# Patient Record
Sex: Female | Born: 1965
Health system: Southern US, Community
[De-identification: ages and names within clinical notes are randomized; demographics above are authoritative.]

## PROBLEM LIST (undated history)

## (undated) DIAGNOSIS — E538 Deficiency of other specified B group vitamins: Secondary | ICD-10-CM

## (undated) DIAGNOSIS — N939 Abnormal uterine and vaginal bleeding, unspecified: Secondary | ICD-10-CM

## (undated) DIAGNOSIS — Z86018 Personal history of other benign neoplasm: Secondary | ICD-10-CM

## (undated) DIAGNOSIS — M25561 Pain in right knee: Secondary | ICD-10-CM

## (undated) DIAGNOSIS — R5383 Other fatigue: Secondary | ICD-10-CM

## (undated) DIAGNOSIS — N63 Unspecified lump in unspecified breast: Secondary | ICD-10-CM

## (undated) DIAGNOSIS — D649 Anemia, unspecified: Secondary | ICD-10-CM

## (undated) DIAGNOSIS — E559 Vitamin D deficiency, unspecified: Secondary | ICD-10-CM

## (undated) DIAGNOSIS — D72819 Decreased white blood cell count, unspecified: Secondary | ICD-10-CM

## (undated) DIAGNOSIS — R5381 Other malaise: Secondary | ICD-10-CM

## (undated) DIAGNOSIS — D1800 Hemangioma unspecified site: Secondary | ICD-10-CM

## (undated) HISTORY — DX: Deficiency of other specified B group vitamins: E53.8

## (undated) HISTORY — DX: Unspecified lump in unspecified breast: N63.0

## (undated) HISTORY — PX: PARATHYROIDECTOMY: SHX19

## (undated) HISTORY — DX: Decreased white blood cell count, unspecified: D72.819

## (undated) HISTORY — DX: Personal history of other benign neoplasm: Z86.018

## (undated) HISTORY — PX: MOUTH SURGERY: SHX715

## (undated) HISTORY — DX: Vitamin D deficiency, unspecified: E55.9

## (undated) HISTORY — DX: Other malaise: R53.83

## (undated) HISTORY — DX: Pain in right knee: M25.561

## (undated) HISTORY — PX: TUBAL LIGATION: SHX77

## (undated) HISTORY — PX: ABDOMINAL HYSTERECTOMY: SHX81

## (undated) HISTORY — DX: Other malaise: R53.81

---

## 2003-11-25 ENCOUNTER — Ambulatory Visit: Payer: Self-pay | Admitting: Internal Medicine

## 2003-12-22 ENCOUNTER — Ambulatory Visit: Payer: Self-pay | Admitting: Internal Medicine

## 2004-03-02 ENCOUNTER — Ambulatory Visit: Payer: Self-pay | Admitting: Internal Medicine

## 2004-03-23 ENCOUNTER — Ambulatory Visit: Payer: Self-pay | Admitting: Internal Medicine

## 2004-06-21 ENCOUNTER — Ambulatory Visit: Payer: Self-pay | Admitting: Internal Medicine

## 2004-07-21 ENCOUNTER — Ambulatory Visit: Payer: Self-pay | Admitting: Internal Medicine

## 2004-11-22 ENCOUNTER — Ambulatory Visit: Payer: Self-pay | Admitting: Unknown Physician Specialty

## 2005-01-09 ENCOUNTER — Ambulatory Visit: Payer: Self-pay | Admitting: Internal Medicine

## 2006-01-03 ENCOUNTER — Ambulatory Visit: Payer: Self-pay

## 2006-09-21 ENCOUNTER — Ambulatory Visit: Payer: Self-pay | Admitting: Internal Medicine

## 2006-10-08 ENCOUNTER — Ambulatory Visit: Payer: Self-pay | Admitting: Internal Medicine

## 2006-10-22 ENCOUNTER — Ambulatory Visit: Payer: Self-pay | Admitting: Internal Medicine

## 2008-02-27 DIAGNOSIS — N63 Unspecified lump in unspecified breast: Secondary | ICD-10-CM | POA: Insufficient documentation

## 2008-03-03 ENCOUNTER — Ambulatory Visit: Payer: Self-pay | Admitting: Family Medicine

## 2008-10-27 ENCOUNTER — Ambulatory Visit: Payer: Self-pay | Admitting: Family Medicine

## 2009-04-06 ENCOUNTER — Ambulatory Visit: Payer: Self-pay | Admitting: Family Medicine

## 2010-08-16 ENCOUNTER — Ambulatory Visit: Payer: Self-pay | Admitting: Family Medicine

## 2011-07-25 ENCOUNTER — Ambulatory Visit: Payer: Self-pay | Admitting: Internal Medicine

## 2011-08-21 ENCOUNTER — Ambulatory Visit: Payer: Self-pay | Admitting: Internal Medicine

## 2011-11-16 LAB — HM PAP SMEAR: HM Pap smear: NORMAL

## 2011-12-06 ENCOUNTER — Ambulatory Visit: Payer: Self-pay | Admitting: Family Medicine

## 2011-12-11 ENCOUNTER — Ambulatory Visit: Payer: Self-pay | Admitting: Family Medicine

## 2012-01-03 ENCOUNTER — Ambulatory Visit: Payer: Self-pay | Admitting: Family Medicine

## 2012-01-17 ENCOUNTER — Ambulatory Visit: Payer: Self-pay | Admitting: Family Medicine

## 2012-07-05 ENCOUNTER — Ambulatory Visit: Payer: Self-pay | Admitting: Family Medicine

## 2012-07-21 ENCOUNTER — Ambulatory Visit: Payer: Self-pay | Admitting: Internal Medicine

## 2013-03-28 ENCOUNTER — Ambulatory Visit: Payer: Self-pay | Admitting: Family Medicine

## 2013-03-28 LAB — HM MAMMOGRAPHY: HM MAMMO: NORMAL

## 2013-04-18 LAB — LIPID PANEL
CHOLESTEROL: 179 mg/dL (ref 0–200)
HDL: 51 mg/dL (ref 35–70)
LDL CALC: 105 mg/dL
Triglycerides: 117 mg/dL (ref 40–160)

## 2013-04-18 LAB — HEMOGLOBIN A1C: HEMOGLOBIN A1C: 5.4 % (ref 4.0–6.0)

## 2013-05-30 ENCOUNTER — Ambulatory Visit: Payer: Self-pay | Admitting: Gastroenterology

## 2013-05-30 LAB — HM COLONOSCOPY

## 2014-10-12 ENCOUNTER — Telehealth: Payer: Self-pay | Admitting: Family Medicine

## 2014-10-12 NOTE — Telephone Encounter (Signed)
Patient informed and appointment made for tomorrow afternoon

## 2014-10-12 NOTE — Telephone Encounter (Signed)
She may want to come in sooner for the problem and keep the CPE appointment

## 2014-10-12 NOTE — Telephone Encounter (Signed)
Patient has annual physical scheduled for Sept 8, 2016 however she has been experiencing heavly prolonged menstrual bleeding and clotting for 2 weeks. Do she need to schedule a sooner appointment or keep the one she has. Please advise

## 2014-10-13 ENCOUNTER — Encounter: Payer: Self-pay | Admitting: Family Medicine

## 2014-10-13 ENCOUNTER — Ambulatory Visit (INDEPENDENT_AMBULATORY_CARE_PROVIDER_SITE_OTHER): Payer: Managed Care, Other (non HMO) | Admitting: Family Medicine

## 2014-10-13 VITALS — BP 126/82 | HR 100 | Temp 98.4°F | Resp 14 | Ht 70.0 in | Wt 266.9 lb

## 2014-10-13 DIAGNOSIS — E538 Deficiency of other specified B group vitamins: Secondary | ICD-10-CM | POA: Diagnosis not present

## 2014-10-13 DIAGNOSIS — D1803 Hemangioma of intra-abdominal structures: Secondary | ICD-10-CM | POA: Insufficient documentation

## 2014-10-13 DIAGNOSIS — E559 Vitamin D deficiency, unspecified: Secondary | ICD-10-CM | POA: Diagnosis not present

## 2014-10-13 DIAGNOSIS — D72819 Decreased white blood cell count, unspecified: Secondary | ICD-10-CM | POA: Insufficient documentation

## 2014-10-13 DIAGNOSIS — Z23 Encounter for immunization: Secondary | ICD-10-CM

## 2014-10-13 DIAGNOSIS — N951 Menopausal and female climacteric states: Secondary | ICD-10-CM

## 2014-10-13 DIAGNOSIS — Z13220 Encounter for screening for lipoid disorders: Secondary | ICD-10-CM

## 2014-10-13 DIAGNOSIS — E669 Obesity, unspecified: Secondary | ICD-10-CM | POA: Diagnosis not present

## 2014-10-13 DIAGNOSIS — R5383 Other fatigue: Secondary | ICD-10-CM

## 2014-10-13 DIAGNOSIS — N921 Excessive and frequent menstruation with irregular cycle: Secondary | ICD-10-CM

## 2014-10-13 DIAGNOSIS — M1710 Unilateral primary osteoarthritis, unspecified knee: Secondary | ICD-10-CM | POA: Insufficient documentation

## 2014-10-13 DIAGNOSIS — M179 Osteoarthritis of knee, unspecified: Secondary | ICD-10-CM | POA: Insufficient documentation

## 2014-10-13 MED ORDER — B-12 500 MCG SL SUBL: 2.0000 | SUBLINGUAL_TABLET | Freq: Every day | SUBLINGUAL | Status: DC

## 2014-10-13 MED ORDER — VITAMIN D 50 MCG (2000 UT) PO CAPS: 1.0000 | ORAL_CAPSULE | Freq: Every day | ORAL | Status: DC

## 2014-10-13 MED ORDER — MULTIVITAMINS PO CAPS: 1.0000 | ORAL_CAPSULE | Freq: Every day | ORAL | Status: DC

## 2014-10-13 NOTE — Progress Notes (Signed)
Name: Sally Lewis   MRN: [id number]    DOB: 04-Sep-1965   Date:10/13/2014       Progress Note  Subjective  Chief Complaint  Chief Complaint  Patient presents with  . Menstrual Problem    onset period august 10 and still on, heavy bleeding at times with clots.   . Fatigue    HPI  Irregular cycles and dysmenorrhea: she states her cycles have been irregular and more painful than usual over the past six months, states cycle has been present for the past 13 days, very heavy in am's, pain has been debilitating a few times during her cycle and was unable to go to work. She has always had cramps but it is worse now.   Fatigue: she has been more tired than usual since her cycle started, no SOB.   Leukopenia: seen by Dr. Ma Hillock, last visit was in 2013 and labs did not show a cause, per patient idiopathic leukopenia.   B12 deficiency: taking otc medication  Vitamin D deficiency: taking otc supplementation.  Perimenopause: having some night sweats, cycles have been irregular, but also more painful and heavier, lasting over one week.   Patient Active Problem List   Diagnosis Date Noted  . Vitamin D deficiency 10/13/2014  . Leukopenia 10/13/2014  . Internal derangement of left knee 10/13/2014  . Obesity (BMI 30-39.9) 10/13/2014  . B12 deficiency 10/13/2014    Past Surgical History  Procedure Laterality Date  . Tubal ligation      Family History  Problem Relation Age of Onset  . Hypertension Mother   . Cancer Father     Prostate  . Sickle cell anemia Sister   . Sickle cell anemia Brother     Social History   Social History  . Marital Status: Married    Spouse Name: N/A  . Number of Children: N/A  . Years of Education: N/A   Occupational History  . Not on file.   Social History Main Topics  . Smoking status: Former Research scientist (life sciences)  . Smokeless tobacco: Never Used  . Alcohol Use: No  . Drug Use: No  . Sexual Activity: Yes   Other Topics Concern  . Not on file   Social  History Narrative     Current outpatient prescriptions:  .  baclofen (LIORESAL) 10 MG tablet, , Disp: , Rfl:  .  ibuprofen (ADVIL,MOTRIN) 800 MG tablet, , Disp: , Rfl:  .  Cholecalciferol (VITAMIN D) 2000 UNITS CAPS, Take 1 capsule (2,000 Units total) by mouth daily., Disp: 30 capsule, Rfl: 0 .  Cyanocobalamin (B-12) 500 MCG SUBL, Place 2 tablets under the tongue daily., Disp: 60 tablet, Rfl: 0 .  Multiple Vitamin (MULTIVITAMIN) capsule, Take 1 capsule by mouth daily., Disp: 30 capsule, Rfl: 0  No Known Allergies   ROS  Constitutional: Negative for fever or weight change.  Respiratory: Negative for cough and shortness of breath.   Cardiovascular: Negative for chest pain or palpitations.  Gastrointestinal: Negative for abdominal pain, no bowel changes.  Musculoskeletal: Negative for gait problem or joint swelling.  Skin: Negative for rash.  Neurological: Negative for dizziness or headache.  No other specific complaints in a complete review of systems (except as listed in HPI above).  Objective  Filed Vitals:   10/13/14 1357  BP: 126/82  Pulse: 100  Temp: 98.4 F (36.9 C)  TempSrc: Oral  Resp: 14  Height: 5\' 10"  (1.778 m)  Weight: 266 lb 14.4 oz (121.065 kg)  SpO2: 97%  Body mass index is 38.3 kg/(m^2).  Physical Exam  Constitutional: Patient appears well-developed and well-nourished. ObeseNo distress.  HEENT: head atraumatic, normocephalic, pupils equal and reactive to light, neck supple, throat within normal limits Cardiovascular: Normal rate, regular rhythm and normal heart sounds.  No murmur heard. No BLE edema. Pulmonary/Chest: Effort normal and breath sounds normal. No respiratory distress. Abdominal: Soft.  There is no tenderness. Psychiatric: Patient has a normal mood and affect. behavior is normal. Judgment and thought content normal. Gynecological exam:   PHQ2/9: Depression screen PHQ 2/9 10/13/2014  Decreased Interest 0  Down, Depressed, Hopeless 0   PHQ - 2 Score 0     Fall Risk: Fall Risk  10/13/2014  Falls in the past year? No      Assessment & Plan  1. Peri-menopausal Discussed symptoms of menopause  2. Leukopenia Recheck level - CBC with Differential/Platelet  3. Vitamin D deficiency  - Cholecalciferol (VITAMIN D) 2000 UNITS CAPS; Take 1 capsule (2,000 Units total) by mouth daily.  Dispense: 30 capsule; Refill: 0 - Vit D  25 hydroxy (rtn osteoporosis monitoring)  4. B12 deficiency  - Cyanocobalamin (B-12) 500 MCG SUBL; Place 2 tablets under the tongue daily.  Dispense: 60 tablet; Refill: 0 - Vitamin B12  5. Lipid screening  - Lipid panel  6. Other fatigue  - Comprehensive metabolic panel - TSH  7. Needs flu shot  - Flu Vaccine QUAD 36+ mos IM  8. Menometrorrhagia  - Thyroid Panel With TSH - Ambulatory referral to Obstetrics / Gynecology   9. Obesity (BMI 30-39.9)  Discussed with the patient the risk posed by an increased BMI. Discussed importance of portion control, calorie counting and at least 150 minutes of physical activity weekly. Avoid sweet beverages and drink more water. Eat at least 6 servings of fruit and vegetables daily

## 2014-10-14 ENCOUNTER — Other Ambulatory Visit: Payer: Self-pay | Admitting: Family Medicine

## 2014-10-15 LAB — COMPREHENSIVE METABOLIC PANEL
A/G RATIO: 1.8 (ref 1.1–2.5)
ALK PHOS: 53 IU/L (ref 39–117)
ALT: 11 IU/L (ref 0–32)
AST: 15 IU/L (ref 0–40)
Albumin: 3.9 g/dL (ref 3.5–5.5)
BILIRUBIN TOTAL: 0.3 mg/dL (ref 0.0–1.2)
BUN/Creatinine Ratio: 10 (ref 9–23)
BUN: 11 mg/dL (ref 6–24)
CHLORIDE: 106 mmol/L (ref 97–108)
CO2: 21 mmol/L (ref 18–29)
Calcium: 9.4 mg/dL (ref 8.7–10.2)
Creatinine, Ser: 1.06 mg/dL — ABNORMAL HIGH (ref 0.57–1.00)
GFR calc non Af Amer: 61 mL/min/{1.73_m2} (ref >59)
GFR, EST AFRICAN AMERICAN: 70 mL/min/{1.73_m2} (ref >59)
Globulin, Total: 2.2 g/dL (ref 1.5–4.5)
Glucose: 95 mg/dL (ref 65–99)
POTASSIUM: 4.5 mmol/L (ref 3.5–5.2)
Sodium: 141 mmol/L (ref 134–144)
TOTAL PROTEIN: 6.1 g/dL (ref 6.0–8.5)

## 2014-10-15 LAB — THYROID PANEL WITH TSH
FREE THYROXINE INDEX: 2.1 (ref 1.2–4.9)
T3 Uptake Ratio: 28 % (ref 24–39)
T4 TOTAL: 7.4 ug/dL (ref 4.5–12.0)
TSH: 2.23 u[IU]/mL (ref 0.450–4.500)

## 2014-10-15 LAB — LIPID PANEL
CHOLESTEROL TOTAL: 158 mg/dL (ref 100–199)
Chol/HDL Ratio: 3 ratio units (ref 0.0–4.4)
HDL: 53 mg/dL (ref >39)
LDL Calculated: 91 mg/dL (ref 0–99)
Triglycerides: 71 mg/dL (ref 0–149)
VLDL Cholesterol Cal: 14 mg/dL (ref 5–40)

## 2014-10-15 LAB — CBC WITH DIFFERENTIAL/PLATELET
BASOS ABS: 0 10*3/uL (ref 0.0–0.2)
Basos: 0 %
EOS (ABSOLUTE): 0 10*3/uL (ref 0.0–0.4)
Eos: 1 %
Hematocrit: 31.7 % — ABNORMAL LOW (ref 34.0–46.6)
Hemoglobin: 10.8 g/dL — ABNORMAL LOW (ref 11.1–15.9)
Immature Grans (Abs): 0 10*3/uL (ref 0.0–0.1)
Immature Granulocytes: 0 %
LYMPHS ABS: 1.4 10*3/uL (ref 0.7–3.1)
Lymphs: 54 %
MCH: 29 pg (ref 26.6–33.0)
MCHC: 34.1 g/dL (ref 31.5–35.7)
MCV: 85 fL (ref 79–97)
MONOS ABS: 0.3 10*3/uL (ref 0.1–0.9)
Monocytes: 12 %
NEUTROS ABS: 0.9 10*3/uL — AB (ref 1.4–7.0)
Neutrophils: 33 %
PLATELETS: 222 10*3/uL (ref 150–379)
RBC: 3.73 x10E6/uL — ABNORMAL LOW (ref 3.77–5.28)
RDW: 15.1 % (ref 12.3–15.4)
WBC: 2.6 10*3/uL — AB (ref 3.4–10.8)

## 2014-10-15 LAB — VITAMIN B12: Vitamin B-12: 280 pg/mL (ref 211–946)

## 2014-10-15 LAB — VITAMIN D 25 HYDROXY (VIT D DEFICIENCY, FRACTURES): VIT D 25 HYDROXY: 16.4 ng/mL — AB (ref 30.0–100.0)

## 2014-10-18 ENCOUNTER — Other Ambulatory Visit: Payer: Self-pay | Admitting: Family Medicine

## 2014-10-18 DIAGNOSIS — D649 Anemia, unspecified: Secondary | ICD-10-CM | POA: Insufficient documentation

## 2014-10-18 MED ORDER — VITAMIN D (ERGOCALCIFEROL) 1.25 MG (50000 UNIT) PO CAPS: 50000.0000 [IU] | ORAL_CAPSULE | ORAL | Status: DC

## 2014-10-19 ENCOUNTER — Telehealth: Payer: Self-pay

## 2014-10-19 NOTE — Telephone Encounter (Signed)
Left Message for patient to return my call for labs. Added on labs (Iron and Ferritin) to [facility].

## 2014-10-19 NOTE — Telephone Encounter (Signed)
-----   Message from Steele Sizer, MD sent at 10/18/2014  9:04 PM EDT ----- WBC is low, seen by Dr. Berton Lan, we will continue to monitor Hgb is low likely from the heavy and longer menstrual bleeding, I will order ferritin and iron studies for add on labs.  Sugar , kidney and liver functions are within normal limits Lipid panel within normal limits Thyroid panel within normal limits B12 and vitamin D are low. She can take SL vitamin B12 otc or come in monthly for B12 injection Vitamin D is low, I will send prescription vitamin D to  pharmacy and once finished she/he needs to take otc vitamin D 2000 units daily

## 2014-10-20 ENCOUNTER — Other Ambulatory Visit: Payer: Self-pay | Admitting: Family Medicine

## 2014-10-20 LAB — FERRITIN: FERRITIN: 12 ng/mL — AB (ref 15–150)

## 2014-10-20 LAB — IRON: IRON: 41 ug/dL (ref 27–159)

## 2014-10-20 MED ORDER — DOCUSATE SODIUM 100 MG PO CAPS: 100.0000 mg | ORAL_CAPSULE | Freq: Two times a day (BID) | ORAL | Status: DC

## 2014-10-20 MED ORDER — FERROUS SULFATE 325 (65 FE) MG PO TABS: 325.0000 mg | ORAL_TABLET | Freq: Every day | ORAL | Status: DC

## 2014-10-20 NOTE — Progress Notes (Signed)
Patient notified

## 2014-10-22 ENCOUNTER — Telehealth: Payer: Self-pay | Admitting: Family Medicine

## 2014-10-22 NOTE — Telephone Encounter (Signed)
errenous °

## 2014-10-29 ENCOUNTER — Ambulatory Visit (INDEPENDENT_AMBULATORY_CARE_PROVIDER_SITE_OTHER): Payer: Managed Care, Other (non HMO) | Admitting: Family Medicine

## 2014-10-29 ENCOUNTER — Encounter: Payer: Self-pay | Admitting: Family Medicine

## 2014-10-29 VITALS — BP 116/66 | HR 88 | Temp 98.9°F | Resp 16 | Ht 69.0 in | Wt 268.6 lb

## 2014-10-29 DIAGNOSIS — N921 Excessive and frequent menstruation with irregular cycle: Secondary | ICD-10-CM | POA: Diagnosis not present

## 2014-10-29 DIAGNOSIS — Z1239 Encounter for other screening for malignant neoplasm of breast: Secondary | ICD-10-CM

## 2014-10-29 DIAGNOSIS — Z0000 Encounter for general adult medical examination without abnormal findings: Secondary | ICD-10-CM | POA: Diagnosis not present

## 2014-10-29 DIAGNOSIS — Z7189 Other specified counseling: Secondary | ICD-10-CM

## 2014-10-29 DIAGNOSIS — Z124 Encounter for screening for malignant neoplasm of cervix: Secondary | ICD-10-CM | POA: Diagnosis not present

## 2014-10-29 DIAGNOSIS — Z01419 Encounter for gynecological examination (general) (routine) without abnormal findings: Secondary | ICD-10-CM

## 2014-10-29 DIAGNOSIS — Z719 Counseling, unspecified: Secondary | ICD-10-CM

## 2014-10-29 NOTE — Patient Instructions (Signed)
Discussed importance of 150 minutes of physical activity weekly, eat two servings of fish weekly, eat one serving of tree nuts ( cashews, pistachios, pecans, almonds..) every other day, eat 6 servings of fruit/vegetables daily and drink plenty of water and avoid sweet beverages. 

## 2014-10-29 NOTE — Progress Notes (Signed)
Name: Sally Lewis   MRN: [id number]    DOB: [birth year]   Date:10/29/2014       Progress Note  Subjective  Chief Complaint  Chief Complaint  Patient presents with  . Annual Exam    HPI  Well woman: she has been having excessive cycles, last one lasted 19 days, causing her to have anemia. She has night sweats and irritability.  She was recently diagnosed with vitamin D deficiency and B12 deficiency also and has started supplementations.    Patient Active Problem List   Diagnosis Date Noted  . Anemia 10/18/2014  . Vitamin D deficiency 10/13/2014  . Leukopenia 10/13/2014  . Knee osteoarthritis 10/13/2014  . Obesity (BMI 30-39.9) 10/13/2014  . B12 deficiency 10/13/2014  . Liver hemangioma 10/13/2014    Past Surgical History  Procedure Laterality Date  . Tubal ligation      Family History  Problem Relation Age of Onset  . Hypertension Mother   . Cancer Father     Prostate  . Sickle cell anemia Sister   . Sickle cell anemia Brother     Social History   Social History  . Marital Status: Married    Spouse Name: N/A  . Number of Children: N/A  . Years of Education: N/A   Occupational History  . Not on file.   Social History Main Topics  . Smoking status: Former Smoker -- 0.50 packs/day for 4 years    Types: Cigarettes    Start date: 10/29/1983    Quit date: 10/29/1987  . Smokeless tobacco: Never Used  . Alcohol Use: No  . Drug Use: No  . Sexual Activity:    Partners: Male   Other Topics Concern  . Not on file   Social History Narrative     Current outpatient prescriptions:  .  baclofen (LIORESAL) 10 MG tablet, , Disp: , Rfl:  .  Cholecalciferol (VITAMIN D) 2000 UNITS CAPS, Take 1 capsule (2,000 Units total) by mouth daily., Disp: 30 capsule, Rfl: 0 .  Cyanocobalamin (B-12) 500 MCG SUBL, Place 2 tablets under the tongue daily., Disp: 60 tablet, Rfl: 0 .  docusate sodium (COLACE) 100 MG capsule, Take 1 capsule (100 mg total) by mouth 2 (two) times  daily., Disp: 30 capsule, Rfl: 3 .  ferrous sulfate 325 (65 FE) MG tablet, Take 1 tablet (325 mg total) by mouth daily with breakfast., Disp: 90 tablet, Rfl: 3 .  ibuprofen (ADVIL,MOTRIN) 800 MG tablet, , Disp: , Rfl:  .  Multiple Vitamin (MULTIVITAMIN) capsule, Take 1 capsule by mouth daily., Disp: 30 capsule, Rfl: 0 .  Vitamin D, Ergocalciferol, (DRISDOL) 50000 UNITS CAPS capsule, Take 1 capsule (50,000 Units total) by mouth every 7 (seven) days., Disp: 12 capsule, Rfl: 0  No Known Allergies   ROS  Constitutional: Negative for fever or weight change.  Respiratory: Negative for cough and shortness of breath.   Cardiovascular: Negative for chest pain or palpitations.  Gastrointestinal: Negative for abdominal pain, no bowel changes.  Musculoskeletal: Negative for gait problem or joint swelling.  Skin: Negative for rash.  Neurological: Negative for dizziness , she has  Headache, from sinus spray  No other specific complaints in a complete review of systems (except as listed in HPI above).  Objective  Filed Vitals:   10/29/14 0857  BP: 116/66  Pulse: 88  Temp: 98.9 F (37.2 C)  TempSrc: Oral  Resp: 16  Height: 5\' 9"  (1.753 m)  Weight: 268 lb 9.6 oz (121.836 kg)  SpO2: 96%    Body mass index is 39.65 kg/(m^2).  Physical Exam  Constitutional: Patient appears well-developed and well-nourished. No distress.  HENT: Head: Normocephalic and atraumatic. Ears: B TMs ok, no erythema or effusion; Nose: Nose normal. Mouth/Throat: Oropharynx is clear and moist. No oropharyngeal exudate.  Eyes: Conjunctivae and EOM are normal. Pupils are equal, round, and reactive to light. No scleral icterus.  Neck: Normal range of motion. Neck supple. No JVD present. No thyromegaly present.  Cardiovascular: Normal rate, regular rhythm and normal heart sounds.  No murmur heard. No BLE edema. Pulmonary/Chest: Effort normal and breath sounds normal. No respiratory distress. Abdominal: Soft. Bowel sounds  are normal, no distension. There is no tenderness. no masses Breast: lumpy breast, no nipple discharge or rashes FEMALE GENITALIA:  External genitalia normal External urethra normal Vaginal vault normal without discharge or lesions Cervix normal without discharge or lesions Bimanual exam normal without masses RECTAL: not done Musculoskeletal: Normal range of motion, no joint effusions. No gross deformities Neurological: he is alert and oriented to person, place, and time. No cranial nerve deficit. Coordination, balance, strength, speech and gait are normal.  Skin: Skin is warm and dry. No rash noted. No erythema.  Psychiatric: Patient has a normal mood and affect. behavior is normal. Judgment and thought content normal.  Recent Results (from the past 2160 hour(s))  CBC with Differential/Platelet     Status: Abnormal   Collection Time: 10/14/14  7:23 AM  Result Value Ref Range   WBC 2.6 (L) 3.4 - 10.8 x10E3/uL   RBC 3.73 (L) 3.77 - 5.28 x10E6/uL   Hemoglobin 10.8 (L) 11.1 - 15.9 g/dL   Hematocrit 31.7 (L) 34.0 - 46.6 %   MCV 85 79 - 97 fL   MCH 29.0 26.6 - 33.0 pg   MCHC 34.1 31.5 - 35.7 g/dL   RDW 15.1 12.3 - 15.4 %   Platelets 222 150 - 379 x10E3/uL   Neutrophils 33 %   Lymphs 54 %   Monocytes 12 %   Eos 1 %   Basos 0 %   Neutrophils Absolute 0.9 (L) 1.4 - 7.0 x10E3/uL   Lymphocytes Absolute 1.4 0.7 - 3.1 x10E3/uL   Monocytes Absolute 0.3 0.1 - 0.9 x10E3/uL   EOS (ABSOLUTE) 0.0 0.0 - 0.4 x10E3/uL   Basophils Absolute 0.0 0.0 - 0.2 x10E3/uL   Immature Granulocytes 0 %   Immature Grans (Abs) 0.0 0.0 - 0.1 x10E3/uL   Hematology Comments: Note:     Comment: Verified by microscopic examination.  Comprehensive metabolic panel     Status: Abnormal   Collection Time: 10/14/14  7:23 AM  Result Value Ref Range   Glucose 95 65 - 99 mg/dL   BUN 11 6 - 24 mg/dL   Creatinine, Ser 1.06 (H) 0.57 - 1.00 mg/dL   GFR calc non Af Amer 61 >59 mL/min/1.73   GFR calc Af Amer 70 >59  mL/min/1.73   BUN/Creatinine Ratio 10 9 - 23   Sodium 141 134 - 144 mmol/L   Potassium 4.5 3.5 - 5.2 mmol/L   Chloride 106 97 - 108 mmol/L   CO2 21 18 - 29 mmol/L   Calcium 9.4 8.7 - 10.2 mg/dL   Total Protein 6.1 6.0 - 8.5 g/dL   Albumin 3.9 3.5 - 5.5 g/dL   Globulin, Total 2.2 1.5 - 4.5 g/dL   Albumin/Globulin Ratio 1.8 1.1 - 2.5   Bilirubin Total 0.3 0.0 - 1.2 mg/dL   Alkaline Phosphatase 53 39 - 117  IU/L   AST 15 0 - 40 IU/L   ALT 11 0 - 32 IU/L  Lipid panel     Status: None   Collection Time: 10/14/14  7:23 AM  Result Value Ref Range   Cholesterol, Total 158 100 - 199 mg/dL   Triglycerides 71 0 - 149 mg/dL   HDL 53 >39 mg/dL    Comment: According to ATP-III Guidelines, HDL-C >59 mg/dL is considered a negative risk factor for CHD.    VLDL Cholesterol Cal 14 5 - 40 mg/dL   LDL Calculated 91 0 - 99 mg/dL   Chol/HDL Ratio 3.0 0.0 - 4.4 ratio units    Comment:                                   T. Chol/HDL Ratio                                             Men  Women                               1/2 Avg.Risk  3.4    3.3                                   Avg.Risk  5.0    4.4                                2X Avg.Risk  9.6    7.1                                3X Avg.Risk 23.4   11.0   Thyroid Panel With TSH     Status: None   Collection Time: 10/14/14  7:23 AM  Result Value Ref Range   TSH 2.230 0.450 - 4.500 uIU/mL   T4, Total 7.4 4.5 - 12.0 ug/dL   T3 Uptake Ratio 28 24 - 39 %   Free Thyroxine Index 2.1 1.2 - 4.9  Vit D  25 hydroxy (rtn osteoporosis monitoring)     Status: Abnormal   Collection Time: 10/14/14  7:23 AM  Result Value Ref Range   Vit D, 25-Hydroxy 16.4 (L) 30.0 - 100.0 ng/mL    Comment: Vitamin D deficiency has been defined by the [place] and an Endocrine Society practice guideline as a level of serum 25-OH vitamin D less than 20 ng/mL (1,2). The Endocrine Society went on to further define vitamin D insufficiency as a level between 21 and  29 ng/mL (2). 1. IOM (Institute of Medicine). 2010. Dietary reference    intakes for calcium and D. [place]: The    [employer]. 2. Holick MF, Binkley Hatley, Bischoff-Ferrari HA, et al.    Evaluation, treatment, and prevention of vitamin D    deficiency: an Endocrine Society clinical practice    guideline. JCEM. 2011 Jul; 96(7):1911-30.   Vitamin B12     Status: None   Collection Time: 10/14/14  7:23 AM  Result Value Ref Range   Vitamin B-12 280 211 -  946 pg/mL  Iron     Status: None   Collection Time: 10/14/14  7:23 AM  Result Value Ref Range   Iron 41 27 - 159 ug/dL  Ferritin     Status: Abnormal   Collection Time: 10/14/14  7:23 AM  Result Value Ref Range   Ferritin 12 (L) 15 - 150 ng/mL     PHQ2/9: Depression screen PHQ 2/9 10/13/2014  Decreased Interest 0  Down, Depressed, Hopeless 0  PHQ - 2 Score 0    Fall Risk: Fall Risk  10/13/2014  Falls in the past year? No     Assessment & Plan  1. Well woman exam   2. Cervical cancer screening  - Pap IG, CT/NG NAA, and HPV (high risk)  3. Health counseling Discussed importance of 150 minutes of physical activity weekly, eat two servings of fish weekly, eat one serving of tree nuts ( cashews, pistachios, pecans, almonds.Marland Kitchen) every other day, eat 6 servings of fruit/vegetables daily and drink plenty of water and avoid sweet beverages.   4. Menorrhagia with irregular cycle Discuss possible  - Ambulatory referral to Obstetrics / Gynecology  5. Breast cancer screening  - MM Digital Screening; Future

## 2014-10-30 ENCOUNTER — Encounter: Payer: Self-pay | Admitting: Family Medicine

## 2014-11-01 LAB — PAP IG, CT-NG NAA, HPV HIGH-RISK: PAP SMEAR COMMENT: 0

## 2014-11-02 ENCOUNTER — Telehealth: Payer: Self-pay

## 2014-11-02 NOTE — Progress Notes (Signed)
Patient notified by messenger.

## 2014-11-02 NOTE — Telephone Encounter (Signed)
-----   Message from Steele Sizer, MD sent at 11/02/2014  8:41 AM EDT ----- Normal pap

## 2014-11-04 ENCOUNTER — Ambulatory Visit
Admission: RE | Admit: 2014-11-04 | Discharge: 2014-11-04 | Disposition: A | Discharge status: Home / Self Care | Payer: Managed Care, Other (non HMO) | Source: Ambulatory Visit | Attending: Family Medicine | Admitting: Family Medicine

## 2014-11-04 DIAGNOSIS — Z1239 Encounter for other screening for malignant neoplasm of breast: Secondary | ICD-10-CM

## 2014-11-04 DIAGNOSIS — Z1231 Encounter for screening mammogram for malignant neoplasm of breast: Secondary | ICD-10-CM | POA: Diagnosis not present

## 2014-11-10 LAB — SPECIMEN STATUS REPORT

## 2014-11-19 ENCOUNTER — Encounter: Payer: Managed Care, Other (non HMO) | Admitting: Obstetrics and Gynecology

## 2014-12-03 ENCOUNTER — Encounter: Payer: Self-pay | Admitting: Obstetrics and Gynecology

## 2014-12-03 ENCOUNTER — Ambulatory Visit (INDEPENDENT_AMBULATORY_CARE_PROVIDER_SITE_OTHER): Payer: Managed Care, Other (non HMO) | Admitting: Obstetrics and Gynecology

## 2014-12-03 VITALS — BP 117/79 | HR 88 | Resp 14 | Ht 69.0 in | Wt 261.6 lb

## 2014-12-03 DIAGNOSIS — N924 Excessive bleeding in the premenopausal period: Secondary | ICD-10-CM

## 2014-12-03 DIAGNOSIS — Z8742 Personal history of other diseases of the female genital tract: Secondary | ICD-10-CM | POA: Diagnosis not present

## 2014-12-03 DIAGNOSIS — Z86018 Personal history of other benign neoplasm: Secondary | ICD-10-CM

## 2014-12-03 NOTE — Progress Notes (Signed)
Subjective:     Sally Lewis is an 49 y.o. P60 female who presents for irregular menses. She had been bleeding regularly. Reports menses 2 months ago was irregular, lasting 19 days, heavy during first 4-5 days changing a pad q 2 hrs.  Notes that next menstrual periods was normal, lasting 6-7 days.  Dysmenorrhea:mild, occurring first 1-2 days of flow. Cyclic symptoms include: bloating and moodiness. Current contraception: tubal ligation. History of abnormal Pap smear: no.  Last pap smear 09/2014.   Menstrual History: OB History    Gravida Para Term Preterm AB TAB SAB Ectopic Multiple Living   3 3 3       3       Menarche age: 3 Patient's last menstrual period was 11/09/2014. Denies h/o STIs.    Past Medical History  Diagnosis Date  . Knee pain, right   . Malaise and fatigue   . Vitamin B 12 deficiency   . Circadian rhythm sleep disorder, shift work type   . Vitamin D deficiency   . Leukopenia   . Lump or mass in breast   . History of uterine fibroid     Past Surgical History  Procedure Laterality Date  . Tubal ligation      Family History  Problem Relation Age of Onset  . Hypertension Mother   . Cancer Father     Prostate  . Sickle cell anemia Sister   . Sickle cell anemia Brother   . Breast cancer Neg Hx     Social History   Social History  . Marital Status: Married    Spouse Name: N/A  . Number of Children: N/A  . Years of Education: N/A   Occupational History  . Not on file.   Social History Main Topics  . Smoking status: Former Smoker -- 0.50 packs/day for 4 years    Types: Cigarettes    Start date: 10/29/1983    Quit date: 10/29/1987  . Smokeless tobacco: Never Used  . Alcohol Use: No  . Drug Use: No  . Sexual Activity:    Partners: Male   Other Topics Concern  . Not on file   Social History Narrative    No Known Allergies    Current Outpatient Prescriptions on File Prior to Visit  Medication Sig Dispense Refill  . Cholecalciferol  (VITAMIN D) 2000 UNITS CAPS Take 1 capsule (2,000 Units total) by mouth daily. 30 capsule 0  . Cyanocobalamin (B-12) 500 MCG SUBL Place 2 tablets under the tongue daily. 60 tablet 0  . docusate sodium (COLACE) 100 MG capsule Take 1 capsule (100 mg total) by mouth 2 (two) times daily. 30 capsule 3  . ferrous sulfate 325 (65 FE) MG tablet Take 1 tablet (325 mg total) by mouth daily with breakfast. 90 tablet 3  . ibuprofen (ADVIL,MOTRIN) 800 MG tablet     . Multiple Vitamin (MULTIVITAMIN) capsule Take 1 capsule by mouth daily. 30 capsule 0  . baclofen (LIORESAL) 10 MG tablet     . Vitamin D, Ergocalciferol, (DRISDOL) 50000 UNITS CAPS capsule Take 1 capsule (50,000 Units total) by mouth every 7 (seven) days. (Patient not taking: Reported on 12/03/2014) 12 capsule 0   No current facility-administered medications on file prior to visit.     Review of Systems  Constitutional: negative for chills, fatigue, fevers and sweats Eyes: negative for irritation, redness and visual disturbance Ears, nose, mouth, throat, and face: negative for hearing loss, nasal congestion, snoring and tinnitus Respiratory: negative for asthma,  cough, sputum Cardiovascular: negative for chest pain, dyspnea, exertional chest pressure/discomfort, irregular heart beat, palpitations and syncope Gastrointestinal: negative for abdominal pain, change in bowel habits, nausea and vomiting Genitourinary: positive for abnormal menstrual periods, negative for genital lesions, sexual problems and vaginal discharge, dysuria and urinary incontinence Integument/breast: negative for breast lump, breast tenderness and nipple discharge Hematologic/lymphatic: negative for bleeding and easy bruising Musculoskeletal:negative for back pain and muscle weakness Neurological: negative for dizziness, headaches, vertigo and weakness Endocrine: negative for diabetic symptoms including polydipsia, polyuria and skin dryness Allergic/Immunologic: negative  for hay fever and urticaria    Objective:    BP 117/79 mmHg  Pulse 88  Resp 14  Ht 5\' 9"  (1.753 m)  Wt 261 lb 9.6 oz (118.661 kg)  BMI 38.61 kg/m2  LMP 11/09/2014  General:   alert, no distress and moderately obese  Skin:    normal and no rash or abnormalities  Neck:  supple, symmetrical, trachea midline and thyroid not enlarged, symmetric, no tenderness/mass/nodules  Abdomen:  soft, non-tender; bowel sounds normal; no masses,  no organomegaly  Pelvic:   cervix normal in appearance, external genitalia normal, no adnexal masses or tenderness, no cervical motion tenderness, positive findings: uterine enlargement, 12-14 week size, mobile, nontender, rectovaginal septum normal and vagina normal without discharge  Extremities:  Normal, without erythema or edema  Neuro:  Grossly normal       Labs:  Lab Results  Component Value Date   TSH 2.230 10/14/2014   T4TOTAL 7.4 10/14/2014   Lab Results  Component Value Date   WBC 2.6* 10/14/2014   HCT 31.7* 10/14/2014     Assessment:   Suspected perimenopausal bleeding   H/o fibroid uterus Anemia (mild)   Plan:    Blood tests: Estradiol, FSH and LH. Pelvic ultrasound.   Has initiated daily iron supplement for iron deficiency anemia.  Endometrial biopsy done today.  Patient to f/u in 2-3 weeks to discuss her results and plans for further evaluation/management.  Handout given for patient to review.     Endometrial Biopsy Procedure Note  Pre-operative Diagnosis: Menorrhagia, likely perimenopausal bleeding, h/o fibroid uterus  Post-operative Diagnosis: same  Indications: abnormal uterine bleeding, enlarged uterus  Procedure Details   Urine pregnancy test was not done, patient has had prior tubal ligation.  The risks (including infection, bleeding, pain, and uterine perforation) and benefits of the procedure were explained to the patient and Verbal informed consent was obtained.     The patient was placed in the dorsal  lithotomy position.  Bimanual exam showed the uterus to be in the neutral position.  A Graves' speculum inserted in the vagina, and the cervix prepped with povidone iodine.   A sharp tenaculum was applied to the anterior lip of the cervix for stabilization.  A sterile uterine sound was used to sound the uterus to a depth of 10 cm.  A Pipelle endometrial aspirator was used to sample the endometrium.  Sample was sent for pathologic examination.  Condition: Stable  Complications: None  Plan: The patient was advised to call for any fever or for prolonged or severe pain or bleeding. She was advised to use OTC ibuprofen as needed for mild to moderate pain. She was advised to avoid vaginal intercourse for 48 hours or until the bleeding has completely stopped.     [name], MD [facility]

## 2014-12-03 NOTE — Patient Instructions (Signed)

## 2014-12-04 ENCOUNTER — Encounter: Payer: Self-pay | Admitting: Obstetrics and Gynecology

## 2014-12-04 LAB — ESTRADIOL: ESTRADIOL: 92.3 pg/mL

## 2014-12-04 LAB — FSH/LH
FSH: 21.4 m[IU]/mL
LH: 24.1 m[IU]/mL

## 2014-12-04 LAB — PROGESTERONE: Progesterone: 2.3 ng/mL

## 2014-12-05 NOTE — Addendum Note (Signed)
Addended by: Augusto Gamble on: 12/05/2014 12:13 AM   Modules accepted: Level of Service

## 2014-12-07 LAB — PATHOLOGY

## 2014-12-15 ENCOUNTER — Ambulatory Visit: Payer: Managed Care, Other (non HMO)

## 2014-12-15 DIAGNOSIS — N924 Excessive bleeding in the premenopausal period: Secondary | ICD-10-CM

## 2014-12-17 ENCOUNTER — Encounter: Payer: Self-pay | Admitting: Obstetrics and Gynecology

## 2014-12-17 ENCOUNTER — Ambulatory Visit (INDEPENDENT_AMBULATORY_CARE_PROVIDER_SITE_OTHER): Payer: Managed Care, Other (non HMO) | Admitting: Obstetrics and Gynecology

## 2014-12-17 VITALS — BP 114/76 | HR 74 | Resp 14 | Ht 69.0 in | Wt 266.3 lb

## 2014-12-17 DIAGNOSIS — D259 Leiomyoma of uterus, unspecified: Secondary | ICD-10-CM | POA: Diagnosis not present

## 2014-12-17 DIAGNOSIS — N924 Excessive bleeding in the premenopausal period: Secondary | ICD-10-CM | POA: Diagnosis not present

## 2014-12-17 NOTE — Progress Notes (Signed)
GYNECOLOGY PROGRESS NOTE  Subjective:    Patient ID: Sally Lewis, female    DOB: Jul 28, 1965, 49 y.o.   MRN: [id number]  HPI  Patient is a 49 y.o. G51P3003 female who presents for f/u of labs and ultrasound. Currently with abnormal uterine bleeding (menorrhagia) and h/o fibroid uterus.   The following portions of the patient's history were reviewed and updated as appropriate: allergies, current medications, past family history, past medical history, past social history, past surgical history and problem list.  Review of Systems A comprehensive review of systems was negative except for: Genitourinary: positive for abnormal menstrual periods   Objective:   Blood pressure 114/76, pulse 74, resp. rate 14, height 5\' 9"  (1.753 m), weight 266 lb 4.8 oz (120.793 kg), last menstrual period 12/06/2014. General appearance: alert and in no distress. Exam deferred.    Labs:   Office Visit on 12/03/2014  Component Date Value Ref Range Status  . [place] 12/03/2014 24.1   Final   Comment:                     Follicular phase        2.4 -  12.6                     Ovulation phase        14.0 -  95.6                     Luteal phase            1.0 -  11.4                     Postmenopausal          7.7 -  58.5   . [facility] 12/03/2014 21.4   Final   Comment:                     Follicular phase        3.5 -  12.5                     Ovulation phase         4.7 -  21.5                     Luteal phase            1.7 -   7.7                     Postmenopausal         25.8 - 134.8   . Estradiol 12/03/2014 92.3   Final   Comment:                     Adult Female:                       Follicular phase   41.6 -   166.0                       Ovulation phase    85.8 -   498.0                       Luteal phase       43.8 -   211.0  Postmenopausal     <6.0 -    54.7                     Pregnancy                       1st trimester     215.0 - >4300.0                     Girls (1-10 years)     6.0 -    27.0 Roche ECLIA methodology   . Progesterone 12/03/2014 2.3   Final   Comment:                      Follicular phase       0.1 -   0.9                      Luteal phase           1.8 -  23.9                      Ovulation phase        0.1 -  12.0                      Pregnant                         First trimester    11.0 -  44.3                         Second trimester   25.4 -  83.3                         Third trimester    58.7 - 214.0                      Postmenopausal         0.0 -   0.1   . PATH REPORT.SITE OF ORIGIN [facility] 12/03/2014 Comment   Final   Comment: Material submitted:                                        Marland Kitchen ENDOMETRIUM, BIOPSY   . . 12/03/2014 Comment   Final   Comment: Clinician provided ICD-10: N92.4   . PATH REPORT.FINAL DX [facility] 12/03/2014 Comment   Final   Comment:  Diagnosis: ENDOMETRIUM, BIOPSY: FOCALLY DISORDERED PROLIFERATIVE PHASE ENDOMETRIUM. NO HYPERPLASIA OR CARCINOMA.    Imaging:  12/15/2014 Pelvic Ultrasound -  Findings:  The uterus measures 10.9 x 7.8 x 9.5cm. Echo texture is heterogenous with evidence of focal masses.  The Endometrium measures 8 mm.  Right Ovary measures 2 x 1.2 x 1.5 cm. It is normal in appearance. Left Ovary measures 2.6 x 1.7 x 1.4 cm. It is normal appearance. Survey of the adnexa demonstrates no adnexal masses. There is no free fluid in the cul de sac.  Impression: 1. Within the uterus are multiple suspected fibroids with the largest measuring: Fibroid 1: left LUS measures 4.3 x 4.3 x 3.8cm. Fibroid 2: Right fundus measures 3.9 x 3.8 x 3.9cm. Fibroid 3: Posterior fundus 3.4 x 3.3 x 3.5cm.  Recommendations:  1.Clinical correlation with the patient's History and Physical Exam.  Assessment:   Perimenopausal bleeding Fibroid uterus  Plan:   - Reviewed labs and ultrasound findings with patient.  - Discussed management options for abnormal uterine bleeding including tranexamic acid (Lysteda),  oral progesterone (Megace), Depo Provera, Mirena IUD, endometrial ablation (possibly with myomectomy) or hysterectomy as definitive surgical management.  Discussed risks and benefits of each method.   Patient desires hysterectomy.  Printed patient education handouts were given to the patient to review at home. To f/u in 2 weeks for pre-op appointment. Patient would still like to preserve ovaries if possible.  Counseled on robotic hysterectomy due to body habitus.    A total of 15 minutes were spent face-to-face with the patient during this encounter and over half of that time dealt with counseling and coordination of care.  [name], MD [facility]

## 2014-12-31 ENCOUNTER — Ambulatory Visit (INDEPENDENT_AMBULATORY_CARE_PROVIDER_SITE_OTHER): Payer: Managed Care, Other (non HMO) | Admitting: Obstetrics and Gynecology

## 2014-12-31 ENCOUNTER — Encounter: Payer: Self-pay | Admitting: Obstetrics and Gynecology

## 2014-12-31 VITALS — BP 117/77 | HR 79 | Ht 69.0 in | Wt 265.6 lb

## 2014-12-31 DIAGNOSIS — E669 Obesity, unspecified: Secondary | ICD-10-CM

## 2014-12-31 DIAGNOSIS — N924 Excessive bleeding in the premenopausal period: Secondary | ICD-10-CM

## 2014-12-31 DIAGNOSIS — D649 Anemia, unspecified: Secondary | ICD-10-CM

## 2014-12-31 DIAGNOSIS — D259 Leiomyoma of uterus, unspecified: Secondary | ICD-10-CM

## 2014-12-31 DIAGNOSIS — N921 Excessive and frequent menstruation with irregular cycle: Secondary | ICD-10-CM

## 2015-01-01 ENCOUNTER — Encounter
Admission: RE | Admit: 2015-01-01 | Discharge: 2015-01-01 | Disposition: A | Discharge status: Home / Self Care | Payer: Managed Care, Other (non HMO) | Source: Ambulatory Visit | Attending: Obstetrics and Gynecology | Admitting: Obstetrics and Gynecology

## 2015-01-01 DIAGNOSIS — Z832 Family history of diseases of the blood and blood-forming organs and certain disorders involving the immune mechanism: Secondary | ICD-10-CM | POA: Diagnosis not present

## 2015-01-01 DIAGNOSIS — Z87891 Personal history of nicotine dependence: Secondary | ICD-10-CM | POA: Diagnosis not present

## 2015-01-01 DIAGNOSIS — D72819 Decreased white blood cell count, unspecified: Secondary | ICD-10-CM | POA: Diagnosis not present

## 2015-01-01 DIAGNOSIS — E559 Vitamin D deficiency, unspecified: Secondary | ICD-10-CM | POA: Diagnosis not present

## 2015-01-01 DIAGNOSIS — Z86018 Personal history of other benign neoplasm: Secondary | ICD-10-CM | POA: Insufficient documentation

## 2015-01-01 DIAGNOSIS — Z793 Long term (current) use of hormonal contraceptives: Secondary | ICD-10-CM | POA: Diagnosis not present

## 2015-01-01 DIAGNOSIS — Z8042 Family history of malignant neoplasm of prostate: Secondary | ICD-10-CM | POA: Diagnosis not present

## 2015-01-01 DIAGNOSIS — D259 Leiomyoma of uterus, unspecified: Secondary | ICD-10-CM | POA: Diagnosis not present

## 2015-01-01 DIAGNOSIS — G4720 Circadian rhythm sleep disorder, unspecified type: Secondary | ICD-10-CM | POA: Diagnosis not present

## 2015-01-01 DIAGNOSIS — Z8249 Family history of ischemic heart disease and other diseases of the circulatory system: Secondary | ICD-10-CM | POA: Diagnosis not present

## 2015-01-01 DIAGNOSIS — N924 Excessive bleeding in the premenopausal period: Secondary | ICD-10-CM | POA: Insufficient documentation

## 2015-01-01 DIAGNOSIS — Z6839 Body mass index (BMI) 39.0-39.9, adult: Secondary | ICD-10-CM | POA: Diagnosis not present

## 2015-01-01 DIAGNOSIS — N939 Abnormal uterine and vaginal bleeding, unspecified: Secondary | ICD-10-CM | POA: Diagnosis present

## 2015-01-01 HISTORY — DX: Hemangioma unspecified site: D18.00

## 2015-01-01 HISTORY — DX: Anemia, unspecified: D64.9

## 2015-01-01 HISTORY — DX: Abnormal uterine and vaginal bleeding, unspecified: N93.9

## 2015-01-01 LAB — URINALYSIS COMPLETE WITH MICROSCOPIC (ARMC ONLY)
BACTERIA UA: NONE SEEN
Bilirubin Urine: NEGATIVE
Glucose, UA: NEGATIVE mg/dL
Hgb urine dipstick: NEGATIVE
Ketones, ur: NEGATIVE mg/dL
Leukocytes, UA: NEGATIVE
NITRITE: NEGATIVE
PROTEIN: NEGATIVE mg/dL
RBC / HPF: NONE SEEN RBC/hpf (ref 0–5)
SPECIFIC GRAVITY, URINE: 1.013 (ref 1.005–1.030)
pH: 6 (ref 5.0–8.0)

## 2015-01-01 LAB — BASIC METABOLIC PANEL
ANION GAP: 6 (ref 5–15)
BUN: 12 mg/dL (ref 6–20)
CHLORIDE: 109 mmol/L (ref 101–111)
CO2: 24 mmol/L (ref 22–32)
Calcium: 9.7 mg/dL (ref 8.9–10.3)
Creatinine, Ser: 0.98 mg/dL (ref 0.44–1.00)
GFR calc Af Amer: >60 mL/min (ref >60)
Glucose, Bld: 86 mg/dL (ref 65–99)
POTASSIUM: 4 mmol/L (ref 3.5–5.1)
SODIUM: 139 mmol/L (ref 135–145)

## 2015-01-01 LAB — CBC
HEMATOCRIT: 38.8 % (ref 35.0–47.0)
HEMOGLOBIN: 12.7 g/dL (ref 12.0–16.0)
MCH: 29.6 pg (ref 26.0–34.0)
MCHC: 32.8 g/dL (ref 32.0–36.0)
MCV: 90.4 fL (ref 80.0–100.0)
Platelets: 183 10*3/uL (ref 150–440)
RBC: 4.29 MIL/uL (ref 3.80–5.20)
RDW: 15 % — AB (ref 11.5–14.5)
WBC: 2.8 10*3/uL — AB (ref 3.6–11.0)

## 2015-01-01 LAB — PREGNANCY, URINE: Preg Test, Ur: NEGATIVE

## 2015-01-01 LAB — TYPE AND SCREEN
ABO/RH(D): O POS
ANTIBODY SCREEN: NEGATIVE

## 2015-01-01 LAB — ABO/RH: ABO/RH(D): O POS

## 2015-01-01 LAB — RAPID HIV SCREEN (HIV 1/2 AB+AG)
HIV 1/2 ANTIBODIES: NONREACTIVE
HIV-1 P24 ANTIGEN - HIV24: NONREACTIVE

## 2015-01-01 NOTE — Patient Instructions (Signed)
  Your procedure is scheduled on: 01/04/15 Mon  Report to Day Surgery.2nd floor medical mall To find out your arrival time please call [phone] between 1PM - 3PM on 01/01/15 Fri  Remember: Instructions that are not followed completely may result in serious medical risk, up to and including death, or upon the discretion of your surgeon and anesthesiologist your surgery may need to be rescheduled.    __x__ 1. Do not eat food or drink liquids after midnight. No gum chewing or hard candies.     ____ 2. No Alcohol for 24 hours before or after surgery.   ____ 3. Bring all medications with you on the day of surgery if instructed.    __x__ 4. Notify your doctor if there is any change in your medical condition     (cold, fever, infections).     Do not wear jewelry, make-up, hairpins, clips or nail polish.  Do not wear lotions, powders, or perfumes. You may wear deodorant.  Do not shave 48 hours prior to surgery. Men may shave face and neck.  Do not bring valuables to the hospital.    [facility] is not responsible for any belongings or valuables.               Contacts, dentures or bridgework may not be worn into surgery.  Leave your suitcase in the car. After surgery it may be brought to your room.  For patients admitted to the hospital, discharge time is determined by your                treatment team.   Patients discharged the day of surgery will not be allowed to drive home.   Please read over the following fact sheets that you were given:      ____ Take these medicines the morning of surgery with A SIP OF WATER:    1. None  2.   3.   4.  5.  6.  ____ Fleet Enema (as directed)   ___x_ Use CHG Soap as directed  ____ Use inhalers on the day of surgery  ____ Stop metformin 2 days prior to surgery    ____ Take 1/2 of usual insulin dose the night before surgery and none on the morning of surgery.   ____ Stop Coumadin/Plavix/aspirin on  ___x_ Stop Anti-inflammatories on  stopped ibuprofen months ago   ____ Stop supplements until after surgery.    ____ Bring C-Pap to the hospital.

## 2015-01-01 NOTE — Progress Notes (Signed)
GYNECOLOGY PROGRESS NOTE  Subjective:    Patient ID: Sally Lewis, female    DOB: May 17, 1965, 49 y.o.   MRN: [id number]  HPI  Patient is a 49 y.o. G14P3003 female who presents for pre-operative exam. Currently with abnormal perimenopausal uterine bleeding (menorrhagia) and h/o fibroid uterus.   The following portions of the patient's history were reviewed and updated as appropriate: allergies, current medications, past family history, past medical history, past social history, past surgical history and problem list.  Review of Systems A comprehensive review of systems was negative except for: Genitourinary: positive for abnormal menstrual periods   Objective:   Blood pressure 117/77, pulse 79, height 5\' 9"  (1.753 m), weight 265 lb 9.6 oz (120.475 kg), last menstrual period 12/06/2014. Body mass index is 39.2 kg/(m^2).  General appearance: alert and in no distress.  General:  Normal, obese  Skin:  normal  HEENT: Normal  Neck: Supple without Adenopathy or Thyromegaly  Lungs:   Heart:    Breasts:   Abdomen:  Pelvis:  M/S   Extremeties:  Neuro:   clear to auscultation bilaterally  Normal without murmur  Not Examined  soft, non-tender; bowel sounds normal; no masses, no organomegaly  Exam deferred to OR  No CVAT  Warm/Dry   Normal               Assessment:   Perimenopausal bleeding Fibroid uterus Obese (Class II)  Plan:   Reiterated management options for abnormal uterine bleeding including tranexamic acid (Lysteda), oral progesterone (Megace), Depo Provera, Mirena IUD, endometrial ablation (possibly with myomectomy) or hysterectomy as definitive surgical management.  Discussed risks and benefits of each method.   Patient desires hysterectomy. Patient would still like to preserve ovaries if possible.  Counseled on robotic hysterectomy due to body habitus. The risks of surgery were discussed in detail with the patient  including but not limited to: bleeding which may require transfusion or reoperation; infection which may require prolonged hospitalization or re-hospitalization and antibiotic therapy; injury to bowel, bladder, ureters and major vessels or other surrounding organs; need for additional procedures including laparotomy; thromboembolic phenomenon, incisional problems and other postoperative or anesthesia complications.  Patient was told that the likelihood that her condition and symptoms will be treated effectively with this surgical management was very high; the postoperative expectations were also discussed in detail. The patient also understands the alternative treatment options which were discussed in full. All questions were answered.  She was told that she will be contacted by our surgical scheduler regarding the time and date of her surgery; routine preoperative instructions of having nothing to eat or drink after midnight on the day prior to surgery and also coming to the hospital 1.5 hours prior to her time of surgery were also emphasized.  She was told she may be called for a preoperative appointment about a week prior to surgery and will be given further preoperative instructions at that visit. Printed patient education handouts about the procedure were given to the patient to review at home.     [name], MD [facility]

## 2015-01-01 NOTE — H&P (Signed)
Subjective:    Patient is a 49 y.o. G77P3003 female scheduled for robotic total laparoscopic hysterectomy. Indications for procedure are abnormal perimenopausal bleeding and uterine fibroids.   Pertinent Gynecological History: Menses: flow is moderate Bleeding: intermenstrual bleeding and dysfunctional uterine bleeding Contraception: tubal ligation Last pap: normal Date: 09/2014. Denies h/o abnormal pap smears.  Denies h/o STIs.   Discussed Blood/Blood Products: yes   Menstrual History: Obstetric History   G3   P3   T3   P0   A0   TAB0   SAB0   E0   M0   L3     # Outcome Date GA Lbr Len/2nd Weight Sex Delivery Anes PTL Lv  3 Term           2 Term           1 Term              Menarche age: 89  Patient's last menstrual period was 12/06/2014.    Past Medical History  Diagnosis Date  . Knee pain, right   . Malaise and fatigue   . Vitamin B 12 deficiency   . Circadian rhythm sleep disorder, shift work type   . Vitamin D deficiency   . Leukopenia   . Lump or mass in breast   . History of uterine fibroid     Past Surgical History  Procedure Laterality Date  . Tubal ligation      Family History  Problem Relation Age of Onset  . Hypertension Mother   . Cancer Father     Prostate  . Sickle cell anemia Sister   . Sickle cell anemia Brother   . Breast cancer Neg Hx     Social History   Social History  . Marital Status: Married    Spouse Name: N/A  . Number of Children: N/A  . Years of Education: N/A   Social History Main Topics  . Smoking status: Former Smoker -- 0.50 packs/day for 4 years    Types: Cigarettes    Start date: 10/29/1983    Quit date: 10/29/1987  . Smokeless tobacco: Never Used  . Alcohol Use: No  . Drug Use: No  . Sexual Activity:    Partners: Male   Other Topics Concern  . None   Social History Narrative    Current Outpatient Prescriptions on File Prior to Visit  Medication Sig Dispense Refill  . baclofen (LIORESAL) 10 MG tablet      . Cholecalciferol (VITAMIN D) 2000 UNITS CAPS Take 1 capsule (2,000 Units total) by mouth daily. 30 capsule 0  . Cyanocobalamin (B-12) 500 MCG SUBL Place 2 tablets under the tongue daily. 60 tablet 0  . docusate sodium (COLACE) 100 MG capsule Take 1 capsule (100 mg total) by mouth 2 (two) times daily. 30 capsule 3  . ferrous sulfate 325 (65 FE) MG tablet Take 1 tablet (325 mg total) by mouth daily with breakfast. 90 tablet 3  . ibuprofen (ADVIL,MOTRIN) 800 MG tablet     . Multiple Vitamin (MULTIVITAMIN) capsule Take 1 capsule by mouth daily. 30 capsule 0  . Vitamin D, Ergocalciferol, (DRISDOL) 50000 UNITS CAPS capsule Take 1 capsule (50,000 Units total) by mouth every 7 (seven) days. 12 capsule 0   No current facility-administered medications on file prior to visit.    No Known Allergies  Review of Systems Constitutional: No recent fever/chills/sweats Respiratory: No recent cough/bronchitis Cardiovascular: No chest pain Gastrointestinal: No recent nausea/vomiting/diarrhea Genitourinary: No UTI symptoms Hematologic/lymphatic:No  history of coagulopathy or recent blood thinner use    Objective:    BP 117/77 mmHg  Pulse 79  Ht 5\' 9"  (1.753 m)  Wt 265 lb 9.6 oz (120.475 kg)  BMI 39.20 kg/m2  LMP 12/06/2014  General:   Normal, obese  Skin:   normal  HEENT:  Normal  Neck:  Supple without Adenopathy or Thyromegaly  Lungs:   Heart:              Breasts:   Abdomen:  Pelvis:  M/S   Extremeties:  Neuro:    clear to auscultation bilaterally   Normal without murmur   Not Examined   soft, non-tender; bowel sounds normal; no masses,  no organomegaly   Exam deferred to OR  No CVAT  Warm/Dry   Normal           Labs:  Office Visit on 12/03/2014  Component Date Value Ref Range Status  . [place] 12/03/2014 24.1   Final   Comment:                     Follicular phase        2.4 -  12.6                     Ovulation phase        14.0 -  95.6                     Luteal phase             1.0 -  11.4                     Postmenopausal          7.7 -  58.5   . [facility] 12/03/2014 21.4   Final   Comment:                     Follicular phase        3.5 -  12.5                     Ovulation phase         4.7 -  21.5                     Luteal phase            1.7 -   7.7                     Postmenopausal         25.8 - 134.8   . Estradiol 12/03/2014 92.3   Final   Comment:                     Adult Female:                       Follicular phase   [id number] -   166.0                       Ovulation phase    85.8 -   498.0                       Luteal phase       43.8 -   211.0  Postmenopausal     <6.0 -    54.7                     Pregnancy                       1st trimester     215.0 - >4300.0                     Girls (1-10 years)    6.0 -    27.0 Roche ECLIA methodology   . Progesterone 12/03/2014 2.3   Final   Comment:                      Follicular phase       0.1 -   0.9                      Luteal phase           1.8 -  23.9                      Ovulation phase        0.1 -  12.0                      Pregnant                         First trimester    11.0 -  44.3                         Second trimester   25.4 -  83.3                         Third trimester    58.7 - 214.0                      Postmenopausal         0.0 -   0.1    Comment: Material submitted:                                        Marland Kitchen ENDOMETRIUM, BIOPSY   . . 12/03/2014 Comment   Final  . PATH REPORT.FINAL DX [facility] 12/03/2014 Comment   Final   Comment:  Diagnosis: ENDOMETRIUM, BIOPSY: FOCALLY DISORDERED PROLIFERATIVE PHASE ENDOMETRIUM. NO HYPERPLASIA OR CARCINOMA. SOL/12/07/2014     Imaging:  Pelvic Ultrasound Findings (12/15/2014):  The uterus measures 10.9 x 7.8 x 9.5cm. Echo texture is heterogenous with evidence of focal masses.  The Endometrium measures 8 mm.  Right Ovary measures 2 x 1.2 x 1.5 cm. It is normal in appearance. Left Ovary measures 2.6 x 1.7 x  1.4 cm. It is normal appearance. Survey of the adnexa demonstrates no adnexal masses. There is no free fluid in the cul de sac.  Impression: 1. Within the uterus are multiple suspected fibroids with the largest measuring: Fibroid 1: left LUS measures 4.3 x 4.3 x 3.8cm. Fibroid 2: Right fundus measures 3.9 x 3.8 x 3.9cm. Fibroid 3: Posterior fundus 3.4 x 3.3 x 3.5cm.  Assessment:   Perimenopausal bleeding Fibroid uterus Oesity   Plan:    Counseling: Procedure, risks, reasons, benefits and complications (  including injury to bowel, bladder, major blood vessel, ureter, bleeding, possibility of transfusion, infection, or fistula formation) reviewed in detail. Consent signed.  Patient scheduled for Robotic total laparoscopic hysterectomy on 01/04/2015.  Desires to preserve ovaries.  Preop testing ordered. Instructions reviewed, including NPO after midnight.   [name], MD [facility]

## 2015-01-02 LAB — RPR: RPR: NONREACTIVE

## 2015-01-02 LAB — HEPATITIS B SURFACE ANTIGEN: Hepatitis B Surface Ag: NEGATIVE

## 2015-01-02 LAB — HEPATITIS C ANTIBODY: HCV Ab: <0.1 s/co ratio (ref 0.0–0.9)

## 2015-01-04 ENCOUNTER — Ambulatory Visit: Payer: Managed Care, Other (non HMO) | Admitting: Anesthesiology

## 2015-01-04 ENCOUNTER — Encounter
Admission: RE | Disposition: A | Discharge status: Home / Self Care | Payer: Self-pay | Source: Ambulatory Visit | Attending: Obstetrics and Gynecology

## 2015-01-04 ENCOUNTER — Observation Stay
Admission: RE | Admit: 2015-01-04 | Discharge: 2015-01-05 | Disposition: A | Discharge status: Home / Self Care | Payer: Managed Care, Other (non HMO) | Source: Ambulatory Visit | Attending: Obstetrics and Gynecology | Admitting: Obstetrics and Gynecology

## 2015-01-04 DIAGNOSIS — Z6839 Body mass index (BMI) 39.0-39.9, adult: Secondary | ICD-10-CM | POA: Insufficient documentation

## 2015-01-04 DIAGNOSIS — D72819 Decreased white blood cell count, unspecified: Secondary | ICD-10-CM | POA: Insufficient documentation

## 2015-01-04 DIAGNOSIS — E559 Vitamin D deficiency, unspecified: Secondary | ICD-10-CM | POA: Insufficient documentation

## 2015-01-04 DIAGNOSIS — Z793 Long term (current) use of hormonal contraceptives: Secondary | ICD-10-CM | POA: Insufficient documentation

## 2015-01-04 DIAGNOSIS — Z8249 Family history of ischemic heart disease and other diseases of the circulatory system: Secondary | ICD-10-CM | POA: Insufficient documentation

## 2015-01-04 DIAGNOSIS — Z87891 Personal history of nicotine dependence: Secondary | ICD-10-CM | POA: Insufficient documentation

## 2015-01-04 DIAGNOSIS — Z9889 Other specified postprocedural states: Secondary | ICD-10-CM

## 2015-01-04 DIAGNOSIS — N924 Excessive bleeding in the premenopausal period: Secondary | ICD-10-CM | POA: Diagnosis not present

## 2015-01-04 DIAGNOSIS — D259 Leiomyoma of uterus, unspecified: Principal | ICD-10-CM | POA: Insufficient documentation

## 2015-01-04 DIAGNOSIS — Z832 Family history of diseases of the blood and blood-forming organs and certain disorders involving the immune mechanism: Secondary | ICD-10-CM | POA: Insufficient documentation

## 2015-01-04 DIAGNOSIS — G4720 Circadian rhythm sleep disorder, unspecified type: Secondary | ICD-10-CM | POA: Insufficient documentation

## 2015-01-04 DIAGNOSIS — N939 Abnormal uterine and vaginal bleeding, unspecified: Secondary | ICD-10-CM | POA: Insufficient documentation

## 2015-01-04 DIAGNOSIS — Z8042 Family history of malignant neoplasm of prostate: Secondary | ICD-10-CM | POA: Insufficient documentation

## 2015-01-04 HISTORY — PX: BILATERAL SALPINGECTOMY: SHX5743

## 2015-01-04 HISTORY — PX: CYSTOSCOPY: SHX5120

## 2015-01-04 HISTORY — PX: ROBOTIC ASSISTED TOTAL HYSTERECTOMY: SHX6085

## 2015-01-04 LAB — POCT PREGNANCY, URINE: Preg Test, Ur: NEGATIVE

## 2015-01-04 SURGERY — ROBOTIC ASSISTED TOTAL HYSTERECTOMY
Anesthesia: General

## 2015-01-04 MED ORDER — OXYCODONE-ACETAMINOPHEN 5-325 MG PO TABS
1.0000 | ORAL_TABLET | ORAL | Status: DC | PRN
  Administered 2015-01-05: 1 via ORAL
  Filled 2015-01-04: qty 2

## 2015-01-04 MED ORDER — FENTANYL CITRATE (PF) 100 MCG/2ML IJ SOLN: 25.0000 ug | INTRAMUSCULAR | Status: DC | PRN

## 2015-01-04 MED ORDER — ONDANSETRON HCL 4 MG/2ML IJ SOLN: 4.0000 mg | Freq: Once | INTRAMUSCULAR | Status: DC | PRN

## 2015-01-04 MED ORDER — HYDROMORPHONE HCL 1 MG/ML IJ SOLN
INTRAMUSCULAR | Status: AC
  Administered 2015-01-04: 0.25 mg via INTRAVENOUS
  Filled 2015-01-04: qty 1

## 2015-01-04 MED ORDER — ROCURONIUM BROMIDE 100 MG/10ML IV SOLN
INTRAVENOUS | Status: DC | PRN
  Administered 2015-01-04: 20 mg via INTRAVENOUS
  Administered 2015-01-04: 10 mg via INTRAVENOUS
  Administered 2015-01-04: 20 mg via INTRAVENOUS
  Administered 2015-01-04: 50 mg via INTRAVENOUS

## 2015-01-04 MED ORDER — KETOROLAC TROMETHAMINE 30 MG/ML IJ SOLN: 30.0000 mg | Freq: Once | INTRAMUSCULAR | Status: DC

## 2015-01-04 MED ORDER — PANTOPRAZOLE SODIUM 40 MG PO TBEC
40.0000 mg | DELAYED_RELEASE_TABLET | Freq: Every day | ORAL | Status: DC
  Administered 2015-01-05: 40 mg via ORAL
  Filled 2015-01-04: qty 1

## 2015-01-04 MED ORDER — MAGNESIUM CITRATE PO SOLN
1.0000 | Freq: Once | ORAL | Status: DC | PRN
  Filled 2015-01-04: qty 296

## 2015-01-04 MED ORDER — ALUM & MAG HYDROXIDE-SIMETH 200-200-20 MG/5ML PO SUSP: 30.0000 mL | ORAL | Status: DC | PRN

## 2015-01-04 MED ORDER — METHYLENE BLUE 1 % INJ SOLN
INTRAMUSCULAR | Status: AC
  Filled 2015-01-04: qty 10

## 2015-01-04 MED ORDER — SUGAMMADEX SODIUM 500 MG/5ML IV SOLN
INTRAVENOUS | Status: DC | PRN
  Administered 2015-01-04: 240 mg via INTRAVENOUS

## 2015-01-04 MED ORDER — ZOLPIDEM TARTRATE 5 MG PO TABS: 5.0000 mg | ORAL_TABLET | Freq: Every evening | ORAL | Status: DC | PRN

## 2015-01-04 MED ORDER — FENTANYL CITRATE (PF) 100 MCG/2ML IJ SOLN
INTRAMUSCULAR | Status: DC | PRN
  Administered 2015-01-04: 100 ug via INTRAVENOUS
  Administered 2015-01-04: 50 ug via INTRAVENOUS
  Administered 2015-01-04: 100 ug via INTRAVENOUS
  Administered 2015-01-04: 50 ug via INTRAVENOUS

## 2015-01-04 MED ORDER — BISACODYL 10 MG RE SUPP: 10.0000 mg | Freq: Every day | RECTAL | Status: DC | PRN

## 2015-01-04 MED ORDER — CITRIC ACID-SODIUM CITRATE 334-500 MG/5ML PO SOLN
30.0000 mL | ORAL | Status: DC
  Filled 2015-01-04: qty 30

## 2015-01-04 MED ORDER — HYDRALAZINE HCL 20 MG/ML IJ SOLN
INTRAMUSCULAR | Status: DC | PRN
  Administered 2015-01-04: 5 mg via INTRAVENOUS

## 2015-01-04 MED ORDER — DEXAMETHASONE SODIUM PHOSPHATE 4 MG/ML IJ SOLN
INTRAMUSCULAR | Status: DC | PRN
  Administered 2015-01-04: 10 mg via INTRAVENOUS

## 2015-01-04 MED ORDER — FLUORESCEIN SODIUM 10 % IJ SOLN
INTRAMUSCULAR | Status: DC | PRN
  Administered 2015-01-04: 5 mL via INTRAVENOUS

## 2015-01-04 MED ORDER — ONDANSETRON HCL 4 MG PO TABS: 4.0000 mg | ORAL_TABLET | Freq: Four times a day (QID) | ORAL | Status: DC | PRN

## 2015-01-04 MED ORDER — FAMOTIDINE 20 MG PO TABS
ORAL_TABLET | ORAL | Status: AC
  Administered 2015-01-04: 20 mg via ORAL
  Filled 2015-01-04: qty 1

## 2015-01-04 MED ORDER — ONDANSETRON HCL 4 MG/2ML IJ SOLN: 4.0000 mg | Freq: Four times a day (QID) | INTRAMUSCULAR | Status: DC | PRN

## 2015-01-04 MED ORDER — MENTHOL 3 MG MT LOZG
1.0000 | LOZENGE | OROMUCOSAL | Status: DC | PRN
  Filled 2015-01-04: qty 9

## 2015-01-04 MED ORDER — PHENYLEPHRINE HCL 10 MG/ML IJ SOLN
INTRAMUSCULAR | Status: DC | PRN
  Administered 2015-01-04: 100 ug via INTRAVENOUS

## 2015-01-04 MED ORDER — FAMOTIDINE 20 MG PO TABS
20.0000 mg | ORAL_TABLET | Freq: Once | ORAL | Status: AC
  Administered 2015-01-04: 20 mg via ORAL

## 2015-01-04 MED ORDER — LACTATED RINGERS IV SOLN
INTRAVENOUS | Status: DC
  Administered 2015-01-04: 11:00:00 via INTRAVENOUS

## 2015-01-04 MED ORDER — LACTATED RINGERS IV SOLN
INTRAVENOUS | Status: DC
  Administered 2015-01-04 – 2015-01-05 (×2): via INTRAVENOUS

## 2015-01-04 MED ORDER — BUPIVACAINE HCL 0.5 % IJ SOLN
INTRAMUSCULAR | Status: DC | PRN
  Administered 2015-01-04: 27 mL

## 2015-01-04 MED ORDER — LIDOCAINE HCL (PF) 1 % IJ SOLN
INTRAMUSCULAR | Status: AC
  Filled 2015-01-04: qty 30

## 2015-01-04 MED ORDER — HYDROMORPHONE HCL 1 MG/ML IJ SOLN
0.2500 mg | INTRAMUSCULAR | Status: DC | PRN
  Administered 2015-01-04 (×6): 0.25 mg via INTRAVENOUS

## 2015-01-04 MED ORDER — PROPOFOL 10 MG/ML IV BOLUS
INTRAVENOUS | Status: DC | PRN
  Administered 2015-01-04: 150 mg via INTRAVENOUS

## 2015-01-04 MED ORDER — IBUPROFEN 800 MG PO TABS
800.0000 mg | ORAL_TABLET | Freq: Four times a day (QID) | ORAL | Status: DC | PRN
  Administered 2015-01-05: 800 mg via ORAL
  Filled 2015-01-04: qty 1

## 2015-01-04 MED ORDER — CEFAZOLIN SODIUM-DEXTROSE 2-3 GM-% IV SOLR
2.0000 g | INTRAVENOUS | Status: AC
  Administered 2015-01-04: 2 g via INTRAVENOUS

## 2015-01-04 MED ORDER — LACTATED RINGERS IV SOLN
INTRAVENOUS | Status: DC
  Administered 2015-01-04 (×2): via INTRAVENOUS

## 2015-01-04 MED ORDER — HYDROMORPHONE HCL 1 MG/ML IJ SOLN
0.2000 mg | INTRAMUSCULAR | Status: DC | PRN
  Administered 2015-01-04 – 2015-01-05 (×3): 0.6 mg via INTRAVENOUS
  Filled 2015-01-04 (×3): qty 1

## 2015-01-04 MED ORDER — DOCUSATE SODIUM 100 MG PO CAPS
100.0000 mg | ORAL_CAPSULE | Freq: Two times a day (BID) | ORAL | Status: DC
  Administered 2015-01-04 – 2015-01-05 (×2): 100 mg via ORAL
  Filled 2015-01-04 (×2): qty 1

## 2015-01-04 MED ORDER — SENNOSIDES-DOCUSATE SODIUM 8.6-50 MG PO TABS
1.0000 | ORAL_TABLET | Freq: Every evening | ORAL | Status: DC | PRN
  Administered 2015-01-05: 1 via ORAL
  Filled 2015-01-04: qty 1

## 2015-01-04 MED ORDER — KETOROLAC TROMETHAMINE 30 MG/ML IJ SOLN
INTRAMUSCULAR | Status: DC | PRN
  Administered 2015-01-04: 30 mg via INTRAVENOUS

## 2015-01-04 MED ORDER — BUPIVACAINE HCL (PF) 0.5 % IJ SOLN
INTRAMUSCULAR | Status: AC
  Filled 2015-01-04: qty 30

## 2015-01-04 MED ORDER — ONDANSETRON HCL 4 MG/2ML IJ SOLN
INTRAMUSCULAR | Status: DC | PRN
  Administered 2015-01-04: 4 mg via INTRAVENOUS

## 2015-01-04 MED ORDER — SIMETHICONE 80 MG PO CHEW
80.0000 mg | CHEWABLE_TABLET | Freq: Four times a day (QID) | ORAL | Status: DC
  Administered 2015-01-04 – 2015-01-05 (×2): 80 mg via ORAL
  Filled 2015-01-04 (×2): qty 1

## 2015-01-04 MED ORDER — FERROUS SULFATE 325 (65 FE) MG PO TABS
325.0000 mg | ORAL_TABLET | Freq: Every day | ORAL | Status: DC
  Administered 2015-01-05: 325 mg via ORAL
  Filled 2015-01-04: qty 1

## 2015-01-04 MED ORDER — CEFAZOLIN SODIUM-DEXTROSE 2-3 GM-% IV SOLR
INTRAVENOUS | Status: AC
  Administered 2015-01-04: 2 g via INTRAVENOUS
  Filled 2015-01-04: qty 50

## 2015-01-04 MED ORDER — FLUORESCEIN SODIUM 10 % IJ SOLN
INTRAMUSCULAR | Status: AC
  Filled 2015-01-04: qty 5

## 2015-01-04 SURGICAL SUPPLY — 63 items
BAG URO DRAIN 2000ML W/SPOUT (MISCELLANEOUS) ×4 IMPLANT
BLADE SURG SZ11 CARB STEEL (BLADE) ×4 IMPLANT
CANISTER SUCT 1200ML W/VALVE (MISCELLANEOUS) ×4 IMPLANT
CANNULA SEALS 8.5MM (CANNULA) ×2
CATH FOLEY 2WAY  5CC 16FR (CATHETERS) ×2
CATH ROBINSON RED A/P 16FR (CATHETERS) ×4 IMPLANT
CATH URTH 16FR FL 2W BLN LF (CATHETERS) ×2 IMPLANT
CHLORAPREP W/TINT 26ML (MISCELLANEOUS) ×4 IMPLANT
CORD BIP STRL DISP 12FT (MISCELLANEOUS) ×4 IMPLANT
CORD MONOPOLAR M/FML 12FT (MISCELLANEOUS) ×4 IMPLANT
COVER TIP SHEARS 8 DVNC (MISCELLANEOUS) ×2 IMPLANT
COVER TIP SHEARS 8MM DA VINCI (MISCELLANEOUS) ×2
DEFOGGER SCOPE WARMER CLEARIFY (MISCELLANEOUS) ×4 IMPLANT
DRAPE 3 ARM ACCESS DA VINCI (DRAPES) ×2
DRAPE 3 ARM ACCESS DVNC (DRAPES) ×2 IMPLANT
DRAPE SHEET LG 3/4 BI-LAMINATE (DRAPES) ×8 IMPLANT
DRAPE UNDER BUTTOCK W/FLU (DRAPES) ×4 IMPLANT
FILTER LAP SMOKE EVAC STRL (MISCELLANEOUS) ×4 IMPLANT
GLOVE BIO SURGEON STRL SZ 6 (GLOVE) ×28 IMPLANT
GLOVE BIOGEL PI IND STRL 6.5 (GLOVE) ×16 IMPLANT
GLOVE BIOGEL PI INDICATOR 6.5 (GLOVE) ×16
GOWN STRL REUS W/ TWL LRG LVL3 (GOWN DISPOSABLE) ×16 IMPLANT
GOWN STRL REUS W/TWL LRG LVL3 (GOWN DISPOSABLE) ×16
GRASPER SUT TROCAR 14GX15 (MISCELLANEOUS) ×4 IMPLANT
IRRIGATION STRYKERFLOW (MISCELLANEOUS) ×2 IMPLANT
IRRIGATOR STRYKERFLOW (MISCELLANEOUS) ×4
IV LACTATED RINGERS 1000ML (IV SOLUTION) ×4 IMPLANT
IV NS 1000ML (IV SOLUTION) ×2
IV NS 1000ML BAXH (IV SOLUTION) ×2 IMPLANT
KIT PINK PAD W/HEAD ARE REST (MISCELLANEOUS) ×4
KIT PINK PAD W/HEAD ARM REST (MISCELLANEOUS) ×2 IMPLANT
LABEL OR SOLS (LABEL) ×4 IMPLANT
LIQUID BAND (GAUZE/BANDAGES/DRESSINGS) ×4 IMPLANT
MANIPULATOR VCARE LG CRV RETR (MISCELLANEOUS) IMPLANT
MANIPULATOR VCARE STD CRV RETR (MISCELLANEOUS) ×4 IMPLANT
NEEDLE VERESS 14GA 120MM (NEEDLE) ×4 IMPLANT
NS IRRIG 1000ML POUR BTL (IV SOLUTION) ×4 IMPLANT
NS IRRIG 500ML POUR BTL (IV SOLUTION) ×4 IMPLANT
OCCLUDER COLPOPNEUMO (BALLOONS) ×8 IMPLANT
PACK GYN LAPAROSCOPIC (MISCELLANEOUS) ×4 IMPLANT
PAD GROUND ADULT SPLIT (MISCELLANEOUS) ×4 IMPLANT
PAD OB MATERNITY 4.3X12.25 (PERSONAL CARE ITEMS) ×4 IMPLANT
PAD PREP 24X41 OB/GYN DISP (PERSONAL CARE ITEMS) ×4 IMPLANT
SCISSORS METZENBAUM CVD 33 (INSTRUMENTS) ×4 IMPLANT
SEAL CANN 8.5 DVNC (CANNULA) ×2 IMPLANT
SET CYSTO W/LG BORE CLAMP LF (SET/KITS/TRAYS/PACK) ×4 IMPLANT
SOLUTION ELECTROLUBE (MISCELLANEOUS) ×4 IMPLANT
SPONGE XRAY 4X4 16PLY STRL (MISCELLANEOUS) ×4 IMPLANT
STAPLER SKIN PROX 35W (STAPLE) ×4 IMPLANT
SURGILUBE 2OZ TUBE FLIPTOP (MISCELLANEOUS) ×4 IMPLANT
SUT DVC VLOC 180 0 12IN GS21 (SUTURE) ×4
SUT MNCRL 4-0 (SUTURE) ×2
SUT MNCRL 4-0 27XMFL (SUTURE) ×2
SUT VIC AB 2-0 CT1 27 (SUTURE) ×2
SUT VIC AB 2-0 CT1 TAPERPNT 27 (SUTURE) ×2 IMPLANT
SUT VICRYL 0 AB UR-6 (SUTURE) ×4 IMPLANT
SUTURE DVC VLC 180 0 12IN GS21 (SUTURE) ×2 IMPLANT
SUTURE MNCRL 4-0 27XMF (SUTURE) ×2 IMPLANT
SYR 50ML LL SCALE MARK (SYRINGE) ×4 IMPLANT
SYRINGE 10CC LL (SYRINGE) ×4 IMPLANT
TROCAR ENDO BLADELESS 11MM (ENDOMECHANICALS) ×4 IMPLANT
TROCAR XCEL 12X100 BLDLESS (ENDOMECHANICALS) ×4 IMPLANT
TUBING INSUFFLATOR HEATED (MISCELLANEOUS) ×4 IMPLANT

## 2015-01-04 NOTE — Discharge Instructions (Signed)
Total Laparoscopic Hysterectomy, Care After Refer to this sheet in the next few weeks. These instructions provide you with information on caring for yourself after your procedure. Your health care provider may also give you more specific instructions. Your treatment has been planned according to current medical practices, but problems sometimes occur. Call your health care provider if you have any problems or questions after your procedure. WHAT TO EXPECT AFTER THE PROCEDURE   Pain and bruising at the incision sites. You will be given pain medicine to control it.  Menopausal symptoms such as hot flashes, night sweats, and insomnia if your ovaries were removed.  Sore throat from the breathing tube that was inserted during surgery. HOME CARE INSTRUCTIONS  Only take over-the-counter or prescription medicines for pain, discomfort, or fever as directed by your health care provider.   Do not take aspirin. It can cause bleeding.   Do not drive when taking pain medicine.   Follow your health care provider's advice regarding diet, exercise, lifting, driving, and general activities.   Resume your usual diet as directed and allowed.   Get plenty of rest and sleep.   Do not douche, use tampons, or have sexual intercourse for at least 8 weeks, or until your health care provider gives you permission.   Change your bandages (dressings) as directed by your health care provider.   Monitor your temperature and notify your health care provider of a fever.   Take showers instead of baths for 2-3 weeks.   Do not drink alcohol until your health care provider gives you permission.   If you develop constipation, you may take a mild laxative with your health care provider's permission. Bran foods may help with constipation problems. Drinking enough fluids to keep your urine clear or pale yellow may help as well.   Try to have someone home with you for 1-2 weeks to help around the house.    Keep all of your follow-up appointments as directed by your health care provider.  SEEK MEDICAL CARE IF:  You have swelling, redness, or increasing pain around your incision sites.   You have pus coming from your incision.   You notice a bad smell coming from your incision.   Your incision breaks open.   You feel dizzy or lightheaded.   You have pain or bleeding when you urinate.   You have persistent diarrhea.   You have persistent nausea and vomiting.   You have abnormal vaginal discharge.   You have a rash.   You have any type of abnormal reaction or develop an allergy to your medicine.   You have poor pain control with your prescribed medicine.  SEEK IMMEDIATE MEDICAL CARE IF:  You have chest pain or shortness of breath.  You have severe abdominal pain that is not relieved with pain medicine.  You have pain or swelling in your legs. MAKE SURE YOU:  Understand these instructions.  Will watch your condition.  Will get help right away if you are not doing well or get worse.   This information is not intended to replace advice given to you by your health care provider. Make sure you discuss any questions you have with your health care provider.   Document Released: 11/27/2012 Document Revised: 02/11/2013 Document Reviewed: 11/27/2012 Elsevier Interactive Patient Education [employer].

## 2015-01-04 NOTE — Anesthesia Preprocedure Evaluation (Signed)
Anesthesia Evaluation  Patient identified by MRN, date of birth, ID band Patient awake    Reviewed: Allergy & Precautions, H&P , NPO status , Patient's Chart, lab work & pertinent test results, reviewed documented beta blocker date and time   Airway Mallampati: II  TM Distance: >3 FB Neck ROM: full    Dental  (+) Teeth Intact   Pulmonary neg pulmonary ROS, Current Smoker, former smoker,    Pulmonary exam normal        Cardiovascular negative cardio ROS Normal cardiovascular exam Rhythm:regular Rate:Normal     Neuro/Psych negative neurological ROS  negative psych ROS   GI/Hepatic negative GI ROS, Neg liver ROS,   Endo/Other  negative endocrine ROSMorbid obesity  Renal/GU negative Renal ROS  negative genitourinary   Musculoskeletal   Abdominal   Peds  Hematology negative hematology ROS (+)   Anesthesia Other Findings   Reproductive/Obstetrics negative OB ROS                             Anesthesia Physical Anesthesia Plan  ASA: III  Anesthesia Plan: General ETT   Post-op Pain Management:    Induction:   Airway Management Planned:   Additional Equipment:   Intra-op Plan:   Post-operative Plan:   Informed Consent: I have reviewed the patients History and Physical, chart, labs and discussed the procedure including the risks, benefits and alternatives for the proposed anesthesia with the patient or authorized representative who has indicated his/her understanding and acceptance.     Plan Discussed with: CRNA  Anesthesia Plan Comments:         Anesthesia Quick Evaluation

## 2015-01-04 NOTE — H&P (Signed)
UPDATE TO PREVIOUS HISTORY AND PHYSICAL  The patient has been seen and examined.  Patient is a 49 y.o. G20P3003 female with abnormal perimenopausal bleeding and fibroid uterus who desires definitive management with hysterectomy. H&P is up to date, with the following changes noted:   1) Planned procedure: obotic total laparoscopic hysterectomy, added bilateral salpingectomy and possible cystoscopy   All questions answered.  Patient can proceed to the OR for scheduled procedure.   [name], MD 01/04/2015 11:53 AM

## 2015-01-04 NOTE — Op Note (Signed)
Robotic Hysterectomy Procedure Note  Indications: The patient is a 49 y.o. y.o. G88P3003 female with a history of abnormal uterine bleeding and fibroids.  She presents today for definitive therapy with hysterectomy.    Pre-operative Diagnosis: Abnormal uterine bleeding (perimenopausal) and fibroid uterus  Post-operative Diagnosis: Same  Procedure: Robotic total laparoscopic hysterectomy, bilateral salpingectomy, cystoscopy  Surgeon: [name], mD  Assistants: None  Anesthesia: General endotracheal anesthesia  ASA Class: 2  Estimated Blood Loss:  less than 50 mL         Drains: foley catheter with 700 ml of clear urine at end of procedure         Total IV Fluids: 1200 ml         Specimens: Uterus with cervix, bilateral fallopian tubes  Findings:  Uterus sounded to 9 cm.  Uterus with multiple leiomyomas.  Fallopian tubes with noted previous surgical interruption bilaterally, otherwise normal.  Normal appearing ovaries. Normal appearing upper abdomen. Cul-ed-sac without lesions or endometriosis.  Procedure Details  The patient was seen in the Holding Room. The risks, benefits, complications, treatment options, and expected outcomes were discussed with the patient.  The patient concurred with the proposed plan, giving informed consent.  The site of surgery properly noted. The patient was taken to Operating Room # 4, identified as Sally Lewis and the procedure verified as robotic total laparoscopic hysterectomy with bilateral salpingectomy. A Time Out was held and the above information confirmed.  After induction of anesthesia, the patient was prepped and draped in the usual sterile manner. Pt was placed in dorsal lithotomy position after anesthesia and draped and prepped in the usual sterile manner. Foley catheter was placed.  A sterile speculum was placed into the vagina.  The cervix was grasped with a single-tooth tenaculum and the uterus was sounded to 10 cm. The balloon  manipulator was then properly placed. The balloon was filled to approximately 3 cc of saline. The cervical cup was placed around the cervix. A vaginal  balloon occluder a lap pad was then placed inside the vagina to help with pneumoperitoneum.    Attention was then turned to the abdomen, where a 10 mm incision was made in the left upper quadrant.  An 11 mm trochar with sheath were inserted into the abdominal cavity under direct visualization approximately 10 cm left lateral to the umbiicus along the midclavicular line.  The abdomen was then insufflated and a pneumoperitoneum was established. Three more ports were then placed. There were two 8 mm ports that were placed 10 cm laterally to the umbilicus on each side, and 1 approximately 10 cm above the umbilicus.  The daVinci camera was then inserted supraumbilically. The initial incision was then changed to a port site with insertion of a a new trochar under direct visualization.  All incisions were injected with local anesthetic (Sensorcaine 0.5%, total of 27 cc) prior to port placement. The daVinci robot was then docked in the normal fashion. The patient was placed in steep Trendelenburg positioning.  Inspection of the pelvis showed a normal uterus, ovaries, and tubes. Fallopian tubes were previously surgically interrupted.  The right mesosalpinx of the fallopian tube was cauterized and cut using the monopolar shears.   The utero-ovarian ligament was also coagulated and cut. The round ligament was coagulated and cut. A bladder flap was created and the bladder was dissected down from the cervix.This entire procedure was then repeated on the left side.   The uterine arteries were then skeletonized, and cauterized using  the vessel sealer device.  The blue balloon cuff was then identified and anincision was made in the cervicovaginal junction on top of the vaginal cuff. This was also repeated posteriorly. The incision was extended laterally, freeing the uterus  from the surrounding vagina. The uterus was then morcellated vaginally and delivered posteriorly through the vagina using the robotic assistant.    The vaginal cuff was closed with a running suture of 0 Vicryl V-lock. The ureters were identified bilaterally. The entire pelvis was hemostatic. The right assistant site was closed with a suture of -0 Vicryl in a figure-of eight manner. The skin was closed with 4-0 Monocryl using figure of eight stitches for all incisions. Liquidband was placed over all incisions.  Attention was then turned to the pelvis where a cystoscopy was performed using a 30-degree cystoscope.  Bilateral efflux of fluroscein dye was noted from ureters.  No suture material was noted in the bladder. The cystoscope was removed from the bladder. The foley catheter was replaced.   The final needle, sponge, and instrument count was correct. The patient tolerated the procedure well. Patient to the recovery room in good condition.           Complications:  None; patient tolerated the procedure well.         Disposition: PACU - hemodynamically stable.         Condition: stable   [name], MD [facility]

## 2015-01-04 NOTE — OR Nursing (Signed)
Clarified with Dr Bronson Ing to add possible cystoscopy to consent. MD has discussed the possibility of this procedure with patient previously.

## 2015-01-04 NOTE — Transfer of Care (Signed)
Immediate Anesthesia Transfer of Care Note  Patient: Sally Lewis  Procedure(s) Performed: Procedure(s): ROBOTIC ASSISTED TOTAL HYSTERECTOMY (N/A) CYSTOSCOPY (N/A) BILATERAL SALPINGECTOMY (Bilateral)  Patient Location: PACU  Anesthesia Type:General  Level of Consciousness: awake, alert , oriented and patient cooperative  Airway & Oxygen Therapy: Patient Spontanous Breathing and Patient connected to face mask oxygen  Post-op Assessment: Report given to RN, Post -op Vital signs reviewed and stable and Patient moving all extremities X 4  Post vital signs: Reviewed and stable  Last Vitals:  Filed Vitals:   01/04/15 1055  BP: 151/89  Pulse: 79  Temp: 36.8 C  Resp: 16    Complications: No apparent anesthesia complications

## 2015-01-04 NOTE — Anesthesia Procedure Notes (Signed)
Procedure Name: Intubation Date/Time: 01/04/2015 12:52 PM Performed by: Lorie Apley Pre-anesthesia Checklist: Patient identified, Emergency Drugs available, Suction available, Patient being monitored and Timeout performed Patient Re-evaluated:Patient Re-evaluated prior to inductionOxygen Delivery Method: Circle system utilized Preoxygenation: Pre-oxygenation with 100% oxygen Intubation Type: IV induction Ventilation: Mask ventilation without difficulty Laryngoscope Size: Mac and 3 Grade View: Grade II Tube type: Oral Tube size: 7.0 mm Number of attempts: 1 Airway Equipment and Method: Stylet Placement Confirmation: ETT inserted through vocal cords under direct vision,  positive ETCO2,  CO2 detector and breath sounds checked- equal and bilateral Secured at: 21 cm Tube secured with: Tape Dental Injury: Teeth and Oropharynx as per pre-operative assessment

## 2015-01-05 ENCOUNTER — Encounter: Payer: Self-pay | Admitting: Obstetrics and Gynecology

## 2015-01-05 DIAGNOSIS — D259 Leiomyoma of uterus, unspecified: Secondary | ICD-10-CM | POA: Diagnosis not present

## 2015-01-05 LAB — CBC
HCT: 33.9 % — ABNORMAL LOW (ref 35.0–47.0)
Hemoglobin: 11.1 g/dL — ABNORMAL LOW (ref 12.0–16.0)
MCH: 29.7 pg (ref 26.0–34.0)
MCHC: 32.7 g/dL (ref 32.0–36.0)
MCV: 90.8 fL (ref 80.0–100.0)
PLATELETS: 169 10*3/uL (ref 150–440)
RBC: 3.74 MIL/uL — ABNORMAL LOW (ref 3.80–5.20)
RDW: 15 % — ABNORMAL HIGH (ref 11.5–14.5)
WBC: 11.8 10*3/uL — ABNORMAL HIGH (ref 3.6–11.0)

## 2015-01-05 LAB — CREATININE, SERUM
CREATININE: 0.97 mg/dL (ref 0.44–1.00)
GFR calc Af Amer: >60 mL/min (ref >60)
GFR calc non Af Amer: >60 mL/min (ref >60)

## 2015-01-05 MED ORDER — OXYCODONE-ACETAMINOPHEN 5-325 MG PO TABS: 1.0000 | ORAL_TABLET | Freq: Four times a day (QID) | ORAL | Status: DC | PRN

## 2015-01-05 MED ORDER — DOCUSATE SODIUM 100 MG PO CAPS: 100.0000 mg | ORAL_CAPSULE | Freq: Two times a day (BID) | ORAL | Status: DC | PRN

## 2015-01-05 MED ORDER — IBUPROFEN 800 MG PO TABS: 800.0000 mg | ORAL_TABLET | Freq: Three times a day (TID) | ORAL | Status: DC | PRN

## 2015-01-05 MED ORDER — SIMETHICONE 80 MG PO CHEW: 80.0000 mg | CHEWABLE_TABLET | Freq: Four times a day (QID) | ORAL | Status: DC

## 2015-01-05 NOTE — Progress Notes (Signed)
D/C order from MD.  Reviewed d/c instructions and prescriptions with patient and answered any questions.  Patient d/c home via wheelchair by nursing/auxillary. 

## 2015-01-05 NOTE — Discharge Summary (Signed)
Gynecology Physician Postoperative Discharge Summary  Patient ID: Sally Lewis MRN: [id number] DOB/AGE: [birth year] 51 y.o.  Admit Date: 01/04/2015 Discharge Date: 01/05/2015  Preoperative Diagnoses: Perimenopausal abnormal uterine bleeding, uterine fibroids  Procedures: Procedure(s) (LRB): ROBOTIC ASSISTED TOTAL HYSTERECTOMY (N/A) CYSTOSCOPY (N/A) BILATERAL SALPINGECTOMY (Bilateral)  Hospital Course:  Sally Lewis is a 49 y.o. [id number]  admitted for scheduled surgery.  She underwent the procedures as mentioned above, her operation was uncomplicated. For further details about surgery, please refer to the operative report. Patient had an uncomplicated postoperative course. By time of discharge on POD#1, her pain was controlled on oral pain medications; she was ambulating, voiding without difficulty, tolerating regular diet and passing flatus. She was deemed stable for discharge to home.   Significant Labs: CBC Latest Ref Rng 01/05/2015 01/01/2015  WBC 3.6 - 11.0 K/uL 11.8(H) 2.8(L)  Hemoglobin 12.0 - 16.0 g/dL 11.1(L) 12.7  Hematocrit 35.0 - 47.0 % 33.9(L) 38.8  Platelets 150 - 440 K/uL 169 183   Lab Results  Component Value Date   CREATININE 0.97 01/05/2015   CREATININE 0.98 01/01/2015    Discharge Exam: Blood pressure 120/70, pulse 78, temperature 98.1 F (36.7 C), temperature source Oral, resp. rate 18, height 5\' 9"  (1.753 m), weight 266 lb (120.657 kg), last menstrual period 01/02/2015, SpO2 98 %. General appearance: alert and no distress  Resp: clear to auscultation bilaterally  Cardio: regular rate and rhythm  GI: soft, non-tender; bowel sounds normal; no masses, no organomegaly.  Incision: C/D/I, no erythema, no drainage noted Pelvic: no blood on pad  Extremities: extremities normal, atraumatic, no cyanosis or edema and Homans sign is negative, no sign of DVT  Discharged Condition: Stable  Disposition: Discharge to home    Medication List    STOP taking these  medications        baclofen 10 MG tablet  Commonly known as:  LIORESAL     ferrous sulfate 325 (65 FE) MG tablet      TAKE these medications        B-12 500 MCG Subl  Place 2 tablets under the tongue daily.     ibuprofen 800 MG tablet  Commonly known as:  ADVIL,MOTRIN  Take 1 tablet (800 mg total) by mouth every 8 (eight) hours as needed for mild pain.     multivitamin capsule  Take 1 capsule by mouth daily.     oxyCODONE-acetaminophen 5-325 MG tablet  Commonly known as:  PERCOCET/ROXICET  Take 1-2 tablets by mouth every 6 (six) hours as needed for severe pain (moderate to severe pain (when tolerating fluids)).     simethicone 80 MG chewable tablet  Commonly known as:  MYLICON  Chew 1 tablet (80 mg total) by mouth 4 (four) times daily.     Vitamin D (Ergocalciferol) 50000 UNITS Caps capsule  Commonly known as:  DRISDOL  Take 1 capsule (50,000 Units total) by mouth every 7 (seven) days.     Vitamin D3 2000 UNITS Tabs  Take 2 capsules by mouth daily.     Vitamin D 2000 UNITS Caps  Take 1 capsule (2,000 Units total) by mouth daily.      ASK your doctor about these medications        docusate sodium 100 MG capsule  Commonly known as:  COLACE  Take 100 mg by mouth 2 (two) times daily.  Ask about: Which instructions should I use?     docusate sodium 100 MG capsule  Commonly known as:  COLACE  Take  1 capsule (100 mg total) by mouth 2 (two) times daily as needed for mild constipation.  Ask about: Which instructions should I use?           Follow-up Information    Follow up with [name], MD In 1 week.   Specialties:  Obstetrics and Gynecology, Radiology   Contact information:   [address] [place] [id number] [phone]       Signed:  [name], MD [facility]

## 2015-01-05 NOTE — Progress Notes (Signed)
1 Day Post-Op Procedure(s) (LRB): ROBOTIC ASSISTED TOTAL HYSTERECTOMY (N/A) CYSTOSCOPY (N/A) BILATERAL SALPINGECTOMY (Bilateral)  Subjective: Patient reports mild incisional pain, tolerating PO and + flatus.  Has not had BM. Has not yet voided without catheter  Objective: General: alert and no distress Resp: clear to auscultation bilaterally Cardio: regular rate and rhythm, S1, S2 normal, no murmur, click, rub or gallop GI: soft, non-tender; bowel sounds normal; no masses,  no organomegaly and incision: bandages clean/dry/intact Extremities: extremities normal, atraumatic, no cyanosis or edema Vaginal Bleeding: none   Labs:   CBC Latest Ref Rng 01/05/2015 01/01/2015  WBC 3.6 - 11.0 K/uL 11.8(H) 2.8(L)  Hemoglobin 12.0 - 16.0 g/dL 11.1(L) 12.7  Hematocrit 35.0 - 47.0 % 33.9(L) 38.8  Platelets 150 - 440 K/uL 169 183    Lab Results  Component Value Date   CREATININE 0.97 01/05/2015   CREATININE 0.98 01/01/2015    Assessment: s/p Procedure(s): ROBOTIC ASSISTED TOTAL HYSTERECTOMY (N/A) CYSTOSCOPY (N/A) BILATERAL SALPINGECTOMY (Bilateral): stable, progressing well and tolerating diet  Plan: Advance diet Encourage ambulation Advance to PO medication Discontinue IV fluids Discharge home     [facility] 01/05/2015, 8:25 AM

## 2015-01-06 LAB — SURGICAL PATHOLOGY

## 2015-01-12 NOTE — Anesthesia Postprocedure Evaluation (Signed)
Anesthesia Post Note  Patient: MELEIA YOUNGE  Procedure(s) Performed: Procedure(s) (LRB): ROBOTIC ASSISTED TOTAL HYSTERECTOMY (N/A) CYSTOSCOPY (N/A) BILATERAL SALPINGECTOMY (Bilateral)  Patient location during evaluation: PACU Anesthesia Type: General Level of consciousness: awake and alert Pain management: pain level controlled Vital Signs Assessment: post-procedure vital signs reviewed and stable Respiratory status: spontaneous breathing, nonlabored ventilation, respiratory function stable and patient connected to nasal cannula oxygen Cardiovascular status: blood pressure returned to baseline and stable Postop Assessment: No signs of nausea or vomiting Anesthetic complications: no    Last Vitals:  Filed Vitals:   01/05/15 0345 01/05/15 0757  BP: 112/70 120/70  Pulse: 78 78  Temp: 36.8 C 36.7 C  Resp: 16 18    Last Pain:  Filed Vitals:   01/05/15 0758  PainSc: 0-No pain                 Molli Barrows

## 2015-01-13 ENCOUNTER — Ambulatory Visit (INDEPENDENT_AMBULATORY_CARE_PROVIDER_SITE_OTHER): Payer: Managed Care, Other (non HMO) | Admitting: Obstetrics and Gynecology

## 2015-01-13 ENCOUNTER — Encounter: Payer: Self-pay | Admitting: Obstetrics and Gynecology

## 2015-01-13 VITALS — BP 115/75 | HR 82 | Ht 69.0 in | Wt 265.7 lb

## 2015-01-13 DIAGNOSIS — Z4889 Encounter for other specified surgical aftercare: Secondary | ICD-10-CM

## 2015-01-13 DIAGNOSIS — Z9889 Other specified postprocedural states: Secondary | ICD-10-CM

## 2015-01-13 DIAGNOSIS — Z90710 Acquired absence of both cervix and uterus: Secondary | ICD-10-CM

## 2015-01-13 NOTE — Progress Notes (Signed)
GYNECOLOGY POST-OPERATIVE VISIT  Subjective:     Sally Lewis is a 49 y.o. female who presents to the clinic 10 days status post robotic assisted laparoscopic hysterectomy for abnormal uterine bleeding and fibroids. Eating a regular diet without difficulty. Bowel movements are normal. Pain is controlled without any medications.  Notes left incision site opened 2 days ago.  Denies drainage, tenderness, swelling, or redness.   The following portions of the patient's history were reviewed and updated as appropriate: allergies, current medications, past family history, past medical history, past social history, past surgical history and problem list.  Review of Systems Pertinent items noted in HPI and remainder of comprehensive ROS otherwise negative.    Objective:    LMP 01/02/2015 General:  alert and no distress  Abdomen: soft, bowel sounds active, non-tender  Incision:   all healing well, no drainage, no erythema, no hernia, no seroma, no swelling, no dehiscence, incision well approximated.  Left lateral incision site with superficial skin separation due to suture disruption.      Assessment:    Doing well postoperatively.  Superficial wound separation of left port incision site.  Plan:    1. Continue any current medications. 2. Wound care discussed.  Steri-strips placed on left lateral incision.  3. Activity restrictions: no bending, stooping, or squatting, no lifting more than 15 pounds and pelvic rest x 5 weeks 4. Anticipated return to work: 5 weeks. 5. Follow up: 5 weeks for final post-operative check.      [name], MD [facility]

## 2015-01-18 ENCOUNTER — Encounter: Payer: Self-pay | Admitting: Family Medicine

## 2015-01-20 ENCOUNTER — Encounter: Payer: Managed Care, Other (non HMO) | Admitting: Obstetrics and Gynecology

## 2015-01-29 ENCOUNTER — Ambulatory Visit: Payer: Managed Care, Other (non HMO) | Admitting: Family Medicine

## 2015-02-18 ENCOUNTER — Encounter: Payer: Self-pay | Admitting: Obstetrics and Gynecology

## 2015-02-18 ENCOUNTER — Ambulatory Visit (INDEPENDENT_AMBULATORY_CARE_PROVIDER_SITE_OTHER): Payer: Managed Care, Other (non HMO) | Admitting: Obstetrics and Gynecology

## 2015-02-18 VITALS — BP 130/80 | HR 99 | Ht 69.0 in | Wt 267.0 lb

## 2015-02-18 DIAGNOSIS — Z90710 Acquired absence of both cervix and uterus: Secondary | ICD-10-CM

## 2015-02-18 DIAGNOSIS — J0110 Acute frontal sinusitis, unspecified: Secondary | ICD-10-CM

## 2015-02-18 DIAGNOSIS — N899 Noninflammatory disorder of vagina, unspecified: Secondary | ICD-10-CM

## 2015-02-18 DIAGNOSIS — Z9889 Other specified postprocedural states: Secondary | ICD-10-CM

## 2015-02-18 DIAGNOSIS — N898 Other specified noninflammatory disorders of vagina: Secondary | ICD-10-CM

## 2015-02-18 MED ORDER — AZITHROMYCIN 250 MG PO TABS: ORAL_TABLET | ORAL | Status: DC

## 2015-02-18 NOTE — Progress Notes (Signed)
GYNECOLOGY POST-OPERATIVE VISIT  Subjective:     Sally Lewis is a 49 y.o. G64P3003 female who presents to the clinic 6 weeks status post robotic assisted laparoscopic hysterectomy with bilateral salpingectomy for abnormal uterine bleeding and fibroids. Eating a regular diet without difficulty. Bowel movements are normal. The patient is not having any pain.  Notes an episode of light spotting ~ 1 week ago, but otherwise denies bleeding.   The following portions of the patient's history were reviewed and updated as appropriate: allergies, current medications, past family history, past medical history, past social history, past surgical history and problem list.  Review of Systems A comprehensive review of systems was negative except for: Ears, nose, mouth, throat, and face: positive for nasal congestion and sinus pressure Respiratory: positive for cough and sputum x 2 weeks with no improvement.  Mucus changed from clear to green-yellow.    Objective:    BP 130/80 mmHg  Pulse 99  Ht 5\' 9"  (1.753 m)  Wt 267 lb (121.11 kg)  BMI 39.41 kg/m2  LMP 01/02/2015 General:  alert and no distress  HEENT: Head: Normocephalic, without obvious abnormality, atraumatic, sinuses tender to percussion Eyes: conjunctivae/corneas clear. PERRL, EOM's intact. Fundi benign. Nose: yellow discharge, moderate congestion, sinus tenderness bilateral Throat: lips, mucosa, and tongue normal; teeth and gums normal  Respiratory: clear to auscultation bilaterally  Cardiovascular:  regular rate and rhythm, S1, S2 normal, no murmur, click, rub or gallop  Abdomen: soft, bowel sounds active, non-tender  Incision:   all healing well, no drainage, no erythema, no hernia, no seroma, no swelling, no dehiscence, incision well approximated.  Pelvis:   vaginal cuff well healed, except small area of granulation tissue noted at right apical region.      Assessment:    Doing well postoperatively.  Vaginal granulation tissue Sinus  infection   Plan:   1. Continue any current medications as needed. 2. Applied silver nitrate stick to granulation tissue, should resolve bleeding.  3. Activity restrictions: pelvic rest x 2 additional weeks.   4. Anticipated return to work: now and without restrictions. 5. Patient requests treatment for sinus infection, has tried OTC products without relief.  Will prescribe Z-pack.  6.  Follow up: as needed, or in 1 year with PCP for routine GYN exam      [name], MD [facility]

## 2015-02-23 ENCOUNTER — Telehealth: Payer: Self-pay

## 2015-02-23 ENCOUNTER — Encounter: Payer: Self-pay | Admitting: Family Medicine

## 2015-02-23 ENCOUNTER — Other Ambulatory Visit: Payer: Self-pay | Admitting: Family Medicine

## 2015-02-23 DIAGNOSIS — E538 Deficiency of other specified B group vitamins: Secondary | ICD-10-CM

## 2015-02-23 DIAGNOSIS — E559 Vitamin D deficiency, unspecified: Secondary | ICD-10-CM

## 2015-02-23 DIAGNOSIS — D509 Iron deficiency anemia, unspecified: Secondary | ICD-10-CM

## 2015-02-23 NOTE — Telephone Encounter (Signed)
Patient was informed that lab order is ready for pick up.

## 2015-02-24 ENCOUNTER — Ambulatory Visit: Payer: Managed Care, Other (non HMO) | Admitting: Family Medicine

## 2015-02-24 ENCOUNTER — Other Ambulatory Visit: Payer: Self-pay | Admitting: Family Medicine

## 2015-02-25 LAB — CBC
Hematocrit: 37.7 % (ref 34.0–46.6)
Hemoglobin: 12.7 g/dL (ref 11.1–15.9)
MCH: 29.8 pg (ref 26.6–33.0)
MCHC: 33.7 g/dL (ref 31.5–35.7)
MCV: 89 fL (ref 79–97)
PLATELETS: 254 10*3/uL (ref 150–379)
RBC: 4.26 x10E6/uL (ref 3.77–5.28)
RDW: 12.8 % (ref 12.3–15.4)
WBC: 2.5 10*3/uL — CL (ref 3.4–10.8)

## 2015-02-25 LAB — VITAMIN D 25 HYDROXY (VIT D DEFICIENCY, FRACTURES): VIT D 25 HYDROXY: 28.8 ng/mL — AB (ref 30.0–100.0)

## 2015-02-25 LAB — FERRITIN: Ferritin: 68 ng/mL (ref 15–150)

## 2015-02-25 LAB — VITAMIN B12: Vitamin B-12: 777 pg/mL (ref 211–946)

## 2015-03-02 ENCOUNTER — Ambulatory Visit: Payer: Managed Care, Other (non HMO) | Admitting: Family Medicine

## 2015-03-16 ENCOUNTER — Encounter: Payer: Self-pay | Admitting: Obstetrics and Gynecology

## 2015-03-29 ENCOUNTER — Encounter: Payer: Self-pay | Admitting: Family Medicine

## 2015-12-10 ENCOUNTER — Encounter: Payer: Managed Care, Other (non HMO) | Admitting: Family Medicine

## 2015-12-28 ENCOUNTER — Other Ambulatory Visit: Payer: Self-pay | Admitting: Family Medicine

## 2016-02-08 ENCOUNTER — Encounter: Payer: Self-pay | Admitting: Family Medicine

## 2016-02-08 ENCOUNTER — Ambulatory Visit (INDEPENDENT_AMBULATORY_CARE_PROVIDER_SITE_OTHER): Payer: Managed Care, Other (non HMO) | Admitting: Family Medicine

## 2016-02-08 VITALS — BP 128/76 | HR 122 | Temp 98.2°F | Resp 18 | Ht 69.0 in | Wt 270.4 lb

## 2016-02-08 DIAGNOSIS — E669 Obesity, unspecified: Secondary | ICD-10-CM

## 2016-02-08 DIAGNOSIS — Z13220 Encounter for screening for lipoid disorders: Secondary | ICD-10-CM

## 2016-02-08 DIAGNOSIS — R000 Tachycardia, unspecified: Secondary | ICD-10-CM | POA: Diagnosis not present

## 2016-02-08 DIAGNOSIS — E559 Vitamin D deficiency, unspecified: Secondary | ICD-10-CM | POA: Diagnosis not present

## 2016-02-08 DIAGNOSIS — D708 Other neutropenia: Secondary | ICD-10-CM | POA: Diagnosis not present

## 2016-02-08 DIAGNOSIS — Z23 Encounter for immunization: Secondary | ICD-10-CM

## 2016-02-08 DIAGNOSIS — M7061 Trochanteric bursitis, right hip: Secondary | ICD-10-CM

## 2016-02-08 DIAGNOSIS — Z0001 Encounter for general adult medical examination with abnormal findings: Secondary | ICD-10-CM | POA: Diagnosis not present

## 2016-02-08 DIAGNOSIS — Z01419 Encounter for gynecological examination (general) (routine) without abnormal findings: Secondary | ICD-10-CM | POA: Diagnosis not present

## 2016-02-08 DIAGNOSIS — M7062 Trochanteric bursitis, left hip: Secondary | ICD-10-CM

## 2016-02-08 DIAGNOSIS — Z1231 Encounter for screening mammogram for malignant neoplasm of breast: Secondary | ICD-10-CM | POA: Diagnosis not present

## 2016-02-08 DIAGNOSIS — E538 Deficiency of other specified B group vitamins: Secondary | ICD-10-CM

## 2016-02-08 DIAGNOSIS — Z1239 Encounter for other screening for malignant neoplasm of breast: Secondary | ICD-10-CM

## 2016-02-08 NOTE — Progress Notes (Signed)
Name: Sally Lewis   MRN: [id number]    DOB: June 18, 1965   Date:02/08/2016       Progress Note  Subjective  Chief Complaint  Chief Complaint  Patient presents with  . Annual Exam    HPI  Well Woman: she is married , she had a hysterectomy last year for menorrhagia and iron deficiency anemia caused by uterine fibroids. She is doing well since. She has some vaginal dryness. No vaginal discharge, denies breast lumps. No change in bowel movements. No bladder problems.   Outer hip pain : she has noticed pain on both outer hips for the past four months, she states it started around the time she increased her walking. She was walking one hours a few times a week. She is not walking over the past month but pain is still present. Pain is described as a toothache. It is waking up at night about 3 times a week because of pain. She shifts position and pain goes away. Worse on right than left. Pain does not radiate. Denies associated back pain. She has bilateral knee from OA that is unchanged. She is not taking any medications for it. Pain right now is 0/10, during sleep it can go up to 1/10  Leukopenia: seen by Dr. Ma Hillock in the past and was diagnosed with idiopathic neutropenia  Vitamin D and B12 deficiency: not currently supplements of B12 , but is taking vitamin D  Tachycardia: when she arrived, but she denies palpitation, SOB or chest pain  Patient Active Problem List   Diagnosis Date Noted  . History of uterine fibroid 01/01/2015  . Anemia 10/18/2014  . Vitamin D deficiency 10/13/2014  . Leukopenia 10/13/2014  . Knee osteoarthritis 10/13/2014  . Obesity (BMI 30-39.9) 10/13/2014  . B12 deficiency 10/13/2014  . Liver hemangioma 10/13/2014    Past Surgical History:  Procedure Laterality Date  . BILATERAL SALPINGECTOMY Bilateral 01/04/2015   Procedure: BILATERAL SALPINGECTOMY;  Surgeon: [name], MD;  Location: [facility];  Service: Gynecology;  Laterality: Bilateral;  . CYSTOSCOPY N/A  01/04/2015   Procedure: CYSTOSCOPY;  Surgeon: [name], MD;  Location: [facility];  Service: Gynecology;  Laterality: N/A;  . MOUTH SURGERY    . ROBOTIC ASSISTED TOTAL HYSTERECTOMY N/A 01/04/2015   Procedure: ROBOTIC ASSISTED TOTAL HYSTERECTOMY;  Surgeon: [name], MD;  Location: [facility];  Service: Gynecology;  Laterality: N/A;  . TUBAL LIGATION      Family History  Problem Relation Age of Onset  . Hypertension Mother   . Cancer Father     Prostate  . Sickle cell anemia Sister   . Sickle cell anemia Brother   . Breast cancer Neg Hx     Social History   Social History  . Marital status: Married    Spouse name: Kasandra Knudsen   . Number of children: 2  . Years of education: 20   Occupational History  . 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 Smoking status: Former Smoker    Packs/day: 0.50    Years: 4.00    Types: Cigarettes    Start date: 10/29/1983    Quit date: 10/29/1987  . Smokeless tobacco: Never Used  . Alcohol use No  . Drug use: No  . Sexual activity: Yes    Partners: Male   Other Topics Concern  . Not on file   Social History Narrative   She is married, children are grown and married.  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  Cholecalciferol (VITAMIN D3) 2000 UNITS TABS, Take 2 capsules by mouth daily., Disp: , Rfl:  .  Cyanocobalamin (B-12) 500 MCG SUBL, Place 2 tablets under the tongue daily., Disp: 60 tablet, Rfl: 0 .  Multiple Vitamin (MULTIVITAMIN) capsule, Take 1 capsule by mouth daily., Disp: 30 capsule, Rfl: 0  No Known Allergies   ROS  Constitutional: Negative for fever or significant weight change.  Respiratory: Negative for cough and shortness of breath.   Cardiovascular: Negative for chest pain or palpitations.  Gastrointestinal: Negative for abdominal pain, no bowel changes.  Musculoskeletal: Negative for  gait problem or joint swelling.  Skin: Negative for rash.  Neurological: Negative for dizziness or headache.  No other specific complaints in a complete review of systems (except as listed in HPI above).  Objective  Vitals:   02/08/16 1101  BP: 128/76  Pulse: (!) 122  Resp: 18  Temp: 98.2 F (36.8 C)  TempSrc: Oral  SpO2: 96%  Weight: 270 lb 6.4 oz (122.7 kg)  Height: 5\' 9"  (1.753 m)    Body mass index is 39.93 kg/m.  Physical Exam  Constitutional: Patient appears well-developed and well-nourished. No distress.  HENT: Head: Normocephalic and atraumatic. Ears: B TMs ok, no erythema or effusion; Nose: Nose normal. Mouth/Throat: Oropharynx is clear and moist. No oropharyngeal exudate.  Eyes: Conjunctivae and EOM are normal. Pupils are equal, round, and reactive to light. No scleral icterus.  Neck: Normal range of motion. Neck supple. No JVD present. No thyromegaly present.  Cardiovascular: Normal rate, regular rhythm and normal heart sounds.  No murmur heard. No BLE edema. Pulmonary/Chest: Effort normal and breath sounds normal. No respiratory distress. Abdominal: Soft. Bowel sounds are normal, no distension. There is no tenderness. no masses Breast: no lumps or masses, no nipple discharge or rashes FEMALE GENITALIA:  External genitalia normal External urethra normal Pelvic not done RECTAL: not done Musculoskeletal: Normal range of motion, no joint effusions. No gross deformities Pain during external rotation of both hips and during palpation of trochanteric bursa Neurological: he is alert and oriented to person, place, and time. No cranial nerve deficit. Coordination, balance, strength, speech and gait are normal.  Skin: Skin is warm and dry. No rash noted. No erythema.  Psychiatric: Patient has a normal mood and affect. behavior is normal. Judgment and thought content normal.  PHQ2/9: Depression screen [facility] 2/9 02/08/2016 10/13/2014  Decreased Interest 0 0  Down, Depressed,  Hopeless 0 0  PHQ - 2 Score 0 0     Fall Risk: Fall Risk  02/08/2016 10/13/2014  Falls in the past year? No No     Functional Status Survey: Is the patient deaf or have difficulty hearing?: No Does the patient have difficulty seeing, even when wearing glasses/contacts?: No Does the patient have difficulty concentrating, remembering, or making decisions?: No Does the patient have difficulty walking or climbing stairs?: No Does the patient have difficulty dressing or bathing?: No Does the patient have difficulty doing errands alone such as visiting a doctor's office or shopping?: No    Assessment & Plan  1. Well woman exam  Discussed importance of 150 minutes of physical activity weekly, eat two servings of fish weekly, eat one serving of tree nuts ( cashews, pistachios, pecans, almonds.Marland Kitchen) every other day, eat 6 servings of fruit/vegetables daily and drink plenty of water  and avoid sweet beverages.   2. Needs flu shot  refused  3. Other neutropenia (HCC)  -CBC  4. B12 deficiency  - Vitamin B12  5. Lipid screening  - Lipid panel  6. Breast cancer screening  - MM Digital Screening; Future  7. Vitamin D deficiency  - VITAMIN D 25 Hydroxy (Vit-D Deficiency, Fractures)  8. Obesity (BMI 30-39.9)  - Hemoglobin A1c - Insulin, fasting  9. Tachycardia  - COMPLETE METABOLIC PANEL WITH GFR - CBC with Differential/Platelet - TSH   10. Trochanteric bursitis of both hips  We will try home exercises first, avoid sleeping on either side - use body pillow, call back for referral to PT or chiropractor

## 2016-02-08 NOTE — Patient Instructions (Signed)

## 2016-02-09 LAB — BASIC METABOLIC PANEL
BUN: 9 mg/dL (ref 4–21)
Glucose: 90 mg/dL
POTASSIUM: 4.5 mmol/L (ref 3.4–5.3)
Sodium: 141 mmol/L (ref 137–147)

## 2016-02-09 LAB — LIPID PANEL
CHOLESTEROL: 190 mg/dL (ref 0–200)
HDL: 53 mg/dL (ref 35–70)
LDL Cholesterol: 109 mg/dL
TRIGLYCERIDES: 140 mg/dL (ref 40–160)

## 2016-02-09 LAB — TSH: TSH: 1.89 u[IU]/mL (ref 0.41–5.90)

## 2016-02-09 LAB — HEMOGLOBIN A1C: HEMOGLOBIN A1C: 5.1

## 2016-02-15 ENCOUNTER — Encounter: Payer: Self-pay | Admitting: Family Medicine

## 2016-02-16 ENCOUNTER — Encounter: Payer: Self-pay | Admitting: Family Medicine

## 2016-02-28 ENCOUNTER — Encounter: Payer: Self-pay | Admitting: Family Medicine

## 2016-03-10 ENCOUNTER — Ambulatory Visit
Admission: RE | Admit: 2016-03-10 | Discharge: 2016-03-10 | Disposition: A | Discharge status: Home / Self Care | Payer: Managed Care, Other (non HMO) | Source: Ambulatory Visit | Attending: Family Medicine | Admitting: Family Medicine

## 2016-03-10 DIAGNOSIS — Z1231 Encounter for screening mammogram for malignant neoplasm of breast: Secondary | ICD-10-CM | POA: Insufficient documentation

## 2016-03-10 DIAGNOSIS — N6489 Other specified disorders of breast: Secondary | ICD-10-CM | POA: Diagnosis not present

## 2016-03-10 DIAGNOSIS — Z1239 Encounter for other screening for malignant neoplasm of breast: Secondary | ICD-10-CM

## 2016-03-15 ENCOUNTER — Other Ambulatory Visit: Payer: Self-pay | Admitting: Family Medicine

## 2016-03-15 DIAGNOSIS — R928 Other abnormal and inconclusive findings on diagnostic imaging of breast: Secondary | ICD-10-CM

## 2016-03-15 DIAGNOSIS — N6489 Other specified disorders of breast: Secondary | ICD-10-CM

## 2016-03-27 ENCOUNTER — Ambulatory Visit
Admission: RE | Admit: 2016-03-27 | Discharge: 2016-03-27 | Disposition: A | Discharge status: Home / Self Care | Payer: Managed Care, Other (non HMO) | Source: Ambulatory Visit | Attending: Family Medicine | Admitting: Family Medicine

## 2016-03-27 DIAGNOSIS — N6489 Other specified disorders of breast: Secondary | ICD-10-CM | POA: Diagnosis present

## 2016-03-27 DIAGNOSIS — N6001 Solitary cyst of right breast: Secondary | ICD-10-CM | POA: Insufficient documentation

## 2016-03-27 DIAGNOSIS — R928 Other abnormal and inconclusive findings on diagnostic imaging of breast: Secondary | ICD-10-CM

## 2017-03-07 ENCOUNTER — Encounter: Payer: Self-pay | Admitting: Family Medicine

## 2017-03-07 ENCOUNTER — Ambulatory Visit: Payer: Managed Care, Other (non HMO) | Admitting: Family Medicine

## 2017-03-07 VITALS — BP 104/62 | HR 94 | Temp 98.2°F | Resp 16 | Ht 69.69 in | Wt 283.1 lb

## 2017-03-07 DIAGNOSIS — E538 Deficiency of other specified B group vitamins: Secondary | ICD-10-CM | POA: Diagnosis not present

## 2017-03-07 DIAGNOSIS — Z01419 Encounter for gynecological examination (general) (routine) without abnormal findings: Secondary | ICD-10-CM | POA: Diagnosis not present

## 2017-03-07 DIAGNOSIS — Z13220 Encounter for screening for lipoid disorders: Secondary | ICD-10-CM | POA: Diagnosis not present

## 2017-03-07 DIAGNOSIS — E559 Vitamin D deficiency, unspecified: Secondary | ICD-10-CM

## 2017-03-07 DIAGNOSIS — E2839 Other primary ovarian failure: Secondary | ICD-10-CM

## 2017-03-07 DIAGNOSIS — Z131 Encounter for screening for diabetes mellitus: Secondary | ICD-10-CM

## 2017-03-07 DIAGNOSIS — D708 Other neutropenia: Secondary | ICD-10-CM | POA: Diagnosis not present

## 2017-03-07 DIAGNOSIS — Z23 Encounter for immunization: Secondary | ICD-10-CM | POA: Diagnosis not present

## 2017-03-07 DIAGNOSIS — Z1231 Encounter for screening mammogram for malignant neoplasm of breast: Secondary | ICD-10-CM

## 2017-03-07 DIAGNOSIS — Z1239 Encounter for other screening for malignant neoplasm of breast: Secondary | ICD-10-CM

## 2017-03-07 NOTE — Progress Notes (Signed)
Name: Sally Lewis   MRN: [id number]    DOB: 21-Dec-1965   Date:03/07/2017       Progress Note  Subjective  Chief Complaint  Chief Complaint  Patient presents with  . Annual Exam    HPI  Patient presents for annual CPE   She has some symptoms of menopause: hot flashes, night sweats and vaginal dryness, she stopped all supplements, she does not want any supplementation or treatment at this time  USPSTF grade A and B recommendations  Depression:  Depression screen [facility] 2/9 03/07/2017 02/08/2016 10/13/2014  Decreased Interest 2 0 0  Down, Depressed, Hopeless 0 0 0  PHQ - 2 Score 2 0 0  Altered sleeping 1 - -  Tired, decreased energy 0 - -  Change in appetite 0 - -  Feeling bad or failure about yourself  0 - -  Trouble concentrating 0 - -  Moving slowly or fidgety/restless 0 - -  Suicidal thoughts 0 - -  PHQ-9 Score 3 - -  Difficult doing work/chores Not difficult at all - -   Hypertension: BP Readings from Last 3 Encounters:  03/07/17 104/62  02/08/16 128/76  02/18/15 130/80   Obesity: Wt Readings from Last 3 Encounters:  03/07/17 283 lb 1.6 oz (128.4 kg)  02/08/16 270 lb 6.4 oz (122.7 kg)  02/18/15 267 lb (121.1 kg)   BMI Readings from Last 3 Encounters:  03/07/17 40.99 kg/m  02/08/16 39.93 kg/m  02/18/15 39.43 kg/m   HIV, hep B, hep C: up to date STD testing and prevention (chl/gon/syphilis): not interested  Intimate partner violence:negative screen  Sexual History/Pain during Intercourse: occasionally has pain secondary to vaginal dryness Menstrual History/LMP/Abnormal Bleeding: s/p hysterectomy  Incontinence Symptoms: none   Advanced Care Planning: A voluntary discussion about advance care planning including the explanation and discussion of advance directives.  Discussed health care proxy and Living will, and the patient was able to identify a health care proxy as husabnd  Patient does not have a living will at present time.   Breast cancer:  HM  Mammogram  Date Value Ref Range Status  03/28/2013 Normal  Final    BRCA gene screening: negative for colon, breast or ovarian cancer Cervical cancer screening: s/p hysterectomy - no need to repeat pap smear  Osteoporosis:  We will schedule  Lipids:  Lab Results  Component Value Date   CHOL 190 02/09/2016   CHOL 158 10/14/2014   CHOL 179 04/18/2013   Lab Results  Component Value Date   HDL 53 02/09/2016   HDL 53 10/14/2014   HDL 51 04/18/2013   Lab Results  Component Value Date   LDLCALC 109 02/09/2016   LDLCALC 91 10/14/2014   LDLCALC 105 04/18/2013   Lab Results  Component Value Date   TRIG 140 02/09/2016   TRIG 71 10/14/2014   TRIG 117 04/18/2013   Lab Results  Component Value Date   CHOLHDL 3.0 10/14/2014   No results found for: LDLDIRECT  Glucose:  Glucose  Date Value Ref Range Status  10/14/2014 95 65 - 99 mg/dL Final   Glucose, Bld  Date Value Ref Range Status  01/01/2015 86 65 - 99 mg/dL Final     Colorectal cancer:  52 years old. Lung cancer: Low Dose CT Chest recommended if Age 77-80 years, 30 pack-year currently smoking OR have quit w/in 15years. Patient does not qualify.   Aspirin: advised her to start 81 mg daily  HFW:YOVZC    Patient Active Problem  List   Diagnosis Date Noted  . History of uterine fibroid 01/01/2015  . Anemia 10/18/2014  . Vitamin D deficiency 10/13/2014  . Leukopenia 10/13/2014  . Knee osteoarthritis 10/13/2014  . Obesity (BMI 30-39.9) 10/13/2014  . B12 deficiency 10/13/2014  . Liver hemangioma 10/13/2014    Past Surgical History:  Procedure Laterality Date  . BILATERAL SALPINGECTOMY Bilateral 01/04/2015   Procedure: BILATERAL SALPINGECTOMY;  Surgeon: [name], MD;  Location: [facility];  Service: Gynecology;  Laterality: Bilateral;  . CYSTOSCOPY N/A 01/04/2015   Procedure: CYSTOSCOPY;  Surgeon: [name], MD;  Location: [facility];  Service: Gynecology;  Laterality: N/A;  . MOUTH SURGERY    . ROBOTIC  ASSISTED TOTAL HYSTERECTOMY N/A 01/04/2015   Procedure: ROBOTIC ASSISTED TOTAL HYSTERECTOMY;  Surgeon: [name], MD;  Location: [facility];  Service: Gynecology;  Laterality: N/A;  . TUBAL LIGATION      Family History  Problem Relation Age of Onset  . Hypertension Mother   . Cancer Father        Prostate  . Sickle cell anemia Sister   . Sickle cell anemia Brother   . Breast cancer Neg Hx     Social History   Socioeconomic History  . Marital status: Married    Spouse name: Kasandra Knudsen   . Number of children: 2  . Years of education: 40  . Highest education level: Not on file  Social Needs  . Financial resource strain: Not hard at all  . Food insecurity - worry: Never true  . Food insecurity - inability: Never true  . Transportation needs - medical: No  . Transportation needs - non-medical: No  Occupational History  . 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 Smoking status: Former Smoker    Packs/day: 0.50    Years: 4.00    Pack years: 2.00    Types: Cigarettes    Start date: 10/29/1983    Last attempt to quit: 10/29/1987    Years since quitting: 29.3  . Smokeless tobacco: Never Used  Substance and Sexual Activity  . Alcohol use: Yes    Alcohol/week: 0.0 oz    Comment: seldom - wine  . Drug use: No  . Sexual activity: Yes    Partners: Male    Birth control/protection: Surgical  Other Topics Concern  . Not on file  Social History Narrative   She is married, children are grown and married.   She finihed  her PhD May 2018  in health services   Teaching online and also at [employer]     No current outpatient medications on file.  No Known Allergies   ROS   Constitutional: Negative for fever, positive for weight change.  Respiratory: Negative for cough and shortness of breath.   Cardiovascular: Negative for chest pain or palpitations.  Gastrointestinal: Negative for abdominal pain,  no bowel changes.  Musculoskeletal: Negative for gait problem or joint swelling.  Skin: Negative for rash.  Neurological: Negative for dizziness or headache.  No other specific complaints in a complete review of systems (except as listed in HPI above).   Objective  Vitals:   03/07/17 1005  BP: 104/62  Pulse: 94  Resp: 16  Temp: 98.2 F (36.8 C)  TempSrc: Oral  SpO2: 97%  Weight: 283 lb 1.6 oz (128.4 kg)  Height: 5' 9.69" (1.77 m)    Body mass index is 40.99 kg/m.  Physical Exam  Constitutional: Patient appears well-developed and obese  No distress.  HENT: Head: Normocephalic and atraumatic. Ears: B TMs ok, no erythema or effusion; Nose: Nose normal. Mouth/Throat: Oropharynx is clear and moist. No oropharyngeal exudate.  Eyes: Conjunctivae and EOM are normal. Pupils are equal, round, and reactive to light. No scleral icterus.  Neck: Normal range of motion. Neck supple. No JVD present. No thyromegaly present.  Cardiovascular: Normal rate, regular rhythm and normal heart sounds.  No murmur heard. No BLE edema. Pulmonary/Chest: Effort normal and breath sounds normal. No respiratory distress. Abdominal: Soft. Bowel sounds are normal, no distension. There is no tenderness. no masses Breast: no lumps or masses, no nipple discharge or rashes FEMALE GENITALIA:  Normal external exam, pelvic not done RECTAL: not done Musculoskeletal: Normal range of motion, no joint effusions. No gross deformities Neurological: he is alert and oriented to person, place, and time. No cranial nerve deficit. Coordination, balance, strength, speech and gait are normal.  Skin: Skin is warm and dry. No rash noted. No erythema.  Psychiatric: Patient has a normal mood and affect. behavior is normal. Judgment and thought content normal.    PHQ2/9: Depression screen [facility] 2/9 03/07/2017 02/08/2016 10/13/2014  Decreased Interest 2 0 0  Down, Depressed, Hopeless 0 0 0  PHQ - 2 Score 2 0 0  Altered sleeping 1 -  -  Tired, decreased energy 0 - -  Change in appetite 0 - -  Feeling bad or failure about yourself  0 - -  Trouble concentrating 0 - -  Moving slowly or fidgety/restless 0 - -  Suicidal thoughts 0 - -  PHQ-9 Score 3 - -  Difficult doing work/chores Not difficult at all - -     Fall Risk: Fall Risk  03/07/2017 02/08/2016 10/13/2014  Falls in the past year? No No No    Functional Status Survey: Is the patient deaf or have difficulty hearing?: No Does the patient have difficulty seeing, even when wearing glasses/contacts?: No Does the patient have difficulty concentrating, remembering, or making decisions?: No Does the patient have difficulty walking or climbing stairs?: No Does the patient have difficulty dressing or bathing?: No Does the patient have difficulty doing errands alone such as visiting a doctor's office or shopping?: No   Assessment & Plan  1. Well woman exam  Discussed importance of 150 minutes of physical activity weekly, eat two servings of fish weekly, eat one serving of tree nuts ( cashews, pistachios, pecans, almonds.Marland Kitchen) every other day, eat 6 servings of fruit/vegetables daily and drink plenty of water and avoid sweet beverages.  - COMPLETE METABOLIC PANEL WITH GFR - EKG  2. Need for immunization against influenza  Flu   3. Breast cancer screening  - MM DIGITAL SCREENING BILATERAL; Future  4. Vitamin D deficiency  - VITAMIN D 25 Hydroxy (Vit-D Deficiency, Fractures)  5. B12 deficiency  - Vitamin B12  6. Lipid screening  - Lipid panel  7. Other neutropenia (HCC)  - CBC with Differential/Platelet  8. Screening for diabetes mellitus  - Hemoglobin A1c - Insulin, random

## 2017-03-08 LAB — CBC WITH DIFFERENTIAL/PLATELET
Basophils Absolute: 9 cells/uL (ref 0–200)
Basophils Relative: 0.4 %
EOS ABS: 30 {cells}/uL (ref 15–500)
Eosinophils Relative: 1.3 %
HCT: 39.8 % (ref 35.0–45.0)
Hemoglobin: 13.4 g/dL (ref 11.7–15.5)
Lymphs Abs: 1083 cells/uL (ref 850–3900)
MCH: 29.9 pg (ref 27.0–33.0)
MCHC: 33.7 g/dL (ref 32.0–36.0)
MCV: 88.8 fL (ref 80.0–100.0)
MPV: 10.6 fL (ref 7.5–12.5)
Monocytes Relative: 14.7 %
NEUTROS PCT: 36.5 %
Neutro Abs: 840 cells/uL — ABNORMAL LOW (ref 1500–7800)
PLATELETS: 177 10*3/uL (ref 140–400)
RBC: 4.48 10*6/uL (ref 3.80–5.10)
RDW: 12.6 % (ref 11.0–15.0)
TOTAL LYMPHOCYTE: 47.1 %
WBC: 2.3 10*3/uL — ABNORMAL LOW (ref 3.8–10.8)
WBCMIX: 338 {cells}/uL (ref 200–950)

## 2017-03-08 LAB — HEMOGLOBIN A1C
EAG (MMOL/L): 5.7 (calc)
HEMOGLOBIN A1C: 5.2 %{Hb} (ref <5.7)
Mean Plasma Glucose: 103 (calc)

## 2017-03-08 LAB — COMPLETE METABOLIC PANEL WITH GFR
AG Ratio: 1.4 (calc) (ref 1.0–2.5)
ALKALINE PHOSPHATASE (APISO): 72 U/L (ref 33–130)
ALT: 18 U/L (ref 6–29)
AST: 20 U/L (ref 10–35)
Albumin: 4.2 g/dL (ref 3.6–5.1)
BILIRUBIN TOTAL: 0.6 mg/dL (ref 0.2–1.2)
BUN: 10 mg/dL (ref 7–25)
CHLORIDE: 107 mmol/L (ref 98–110)
CO2: 29 mmol/L (ref 20–32)
Calcium: 10.4 mg/dL (ref 8.6–10.4)
Creat: 1.03 mg/dL (ref 0.50–1.05)
GFR, EST AFRICAN AMERICAN: 72 mL/min/{1.73_m2} (ref >=60)
GFR, Est Non African American: 62 mL/min/{1.73_m2} (ref >=60)
GLUCOSE: 82 mg/dL (ref 65–99)
Globulin: 2.9 g/dL (calc) (ref 1.9–3.7)
Potassium: 4.7 mmol/L (ref 3.5–5.3)
Sodium: 141 mmol/L (ref 135–146)
TOTAL PROTEIN: 7.1 g/dL (ref 6.1–8.1)

## 2017-03-08 LAB — VITAMIN B12: Vitamin B-12: 747 pg/mL (ref 200–1100)

## 2017-03-08 LAB — LIPID PANEL
CHOLESTEROL: 178 mg/dL (ref <200)
HDL: 64 mg/dL (ref >50)
LDL CHOLESTEROL (CALC): 97 mg/dL
Non-HDL Cholesterol (Calc): 114 mg/dL (calc) (ref <130)
TRIGLYCERIDES: 78 mg/dL (ref <150)
Total CHOL/HDL Ratio: 2.8 (calc) (ref <5.0)

## 2017-03-08 LAB — VITAMIN D 25 HYDROXY (VIT D DEFICIENCY, FRACTURES): VIT D 25 HYDROXY: 23 ng/mL — AB (ref 30–100)

## 2017-03-08 LAB — INSULIN, RANDOM: Insulin: 4.3 u[IU]/mL (ref 2.0–19.6)

## 2017-04-05 ENCOUNTER — Ambulatory Visit
Admission: RE | Admit: 2017-04-05 | Discharge: 2017-04-05 | Disposition: A | Discharge status: Home / Self Care | Payer: 59 | Source: Ambulatory Visit | Attending: Family Medicine | Admitting: Family Medicine

## 2017-04-05 DIAGNOSIS — Z1231 Encounter for screening mammogram for malignant neoplasm of breast: Secondary | ICD-10-CM | POA: Insufficient documentation

## 2017-04-05 DIAGNOSIS — Z1239 Encounter for other screening for malignant neoplasm of breast: Secondary | ICD-10-CM

## 2017-04-06 ENCOUNTER — Encounter: Payer: Self-pay | Admitting: Family Medicine

## 2017-04-12 ENCOUNTER — Encounter: Payer: Self-pay | Admitting: Family Medicine

## 2017-04-12 ENCOUNTER — Ambulatory Visit: Payer: 59 | Admitting: Family Medicine

## 2017-04-12 VITALS — BP 122/78 | HR 100 | Temp 98.3°F | Resp 16 | Wt 283.2 lb

## 2017-04-12 DIAGNOSIS — R52 Pain, unspecified: Secondary | ICD-10-CM | POA: Diagnosis not present

## 2017-04-12 DIAGNOSIS — J0100 Acute maxillary sinusitis, unspecified: Secondary | ICD-10-CM | POA: Diagnosis not present

## 2017-04-12 DIAGNOSIS — D709 Neutropenia, unspecified: Secondary | ICD-10-CM

## 2017-04-12 LAB — POCT INFLUENZA A/B
INFLUENZA B, POC: NEGATIVE
Influenza A, POC: NEGATIVE

## 2017-04-12 MED ORDER — AMOXICILLIN-POT CLAVULANATE 875-125 MG PO TABS: 1.0000 | ORAL_TABLET | Freq: Two times a day (BID) | ORAL | 0 refills | Status: AC

## 2017-04-12 NOTE — Progress Notes (Signed)
BP 122/78   Pulse 100   Temp 98.3 F (36.8 C) (Oral)   Resp 16   Wt 283 lb 3.2 oz (128.5 kg)   LMP 01/02/2015   SpO2 95%   BMI 41.00 kg/m    Subjective:    Patient ID: Sally Lewis, female    DOB: 23-May-1965, 52 y.o.   MRN: [id number]  HPI: Sally Lewis is a 52 y.o. female  Chief Complaint  Patient presents with  . URI    onset several days symptoms include: body aches, chills, nasal congestion, runny nose, headach ear pain and fatigue    HPI Patient is here for an acute visit She started to get sick on: two days  Early symptoms included: fatigue, congestion in the head, sinus congestion, sore throat, just a little irritated Other symptoms: right ear pain, no drainage, no problems with hearing Pertinent negatives: rash, fever, cough Further details: no travel Remedies tried: no Sick contacts: works from home; no sick contacts Has had sinus infections  ANC 840; other provider offered referral to heme-onc Going on for years; she saw Dr. Ma Hillock for 2-3 years; he ran every test, even did a bone marrow; no cause ever found  Depression screen [facility] 2/9 03/07/2017 02/08/2016 10/13/2014  Decreased Interest 2 0 0  Down, Depressed, Hopeless 0 0 0  PHQ - 2 Score 2 0 0  Altered sleeping 1 - -  Tired, decreased energy 0 - -  Change in appetite 0 - -  Feeling bad or failure about yourself  0 - -  Trouble concentrating 0 - -  Moving slowly or fidgety/restless 0 - -  Suicidal thoughts 0 - -  PHQ-9 Score 3 - -  Difficult doing work/chores Not difficult at all - -    Relevant past medical, surgical, family and social history reviewed Past Medical History:  Diagnosis Date  . Abnormal uterine bleeding   . Anemia   . Hemangioma    on liver  . History of uterine fibroid   . Knee pain, right   . Leukopenia   . Lump or mass in breast   . Malaise and fatigue   . Vitamin B 12 deficiency   . Vitamin D deficiency    Past Surgical History:  Procedure Laterality Date  .  BILATERAL SALPINGECTOMY Bilateral 01/04/2015   Procedure: BILATERAL SALPINGECTOMY;  Surgeon: [name], MD;  Location: [facility];  Service: Gynecology;  Laterality: Bilateral;  . CYSTOSCOPY N/A 01/04/2015   Procedure: CYSTOSCOPY;  Surgeon: [name], MD;  Location: [facility];  Service: Gynecology;  Laterality: N/A;  . MOUTH SURGERY    . ROBOTIC ASSISTED TOTAL HYSTERECTOMY N/A 01/04/2015   Procedure: ROBOTIC ASSISTED TOTAL HYSTERECTOMY;  Surgeon: [name], MD;  Location: [facility];  Service: Gynecology;  Laterality: N/A;  . TUBAL LIGATION     Family History  Problem Relation Age of Onset  . Hypertension Mother   . Cancer Father        Prostate  . Sickle cell anemia Sister   . Sickle cell anemia Brother   . Breast cancer Neg Hx    Social History   Tobacco Use  . Smoking status: Former Smoker    Packs/day: 0.50    Years: 4.00    Pack years: 2.00    Types: Cigarettes    Start date: 10/29/1983    Last attempt to quit: 10/29/1987    Years since quitting: 29.4  . Smokeless tobacco: Never Used  Substance Use  Topics  . Alcohol use: Yes    Alcohol/week: 0.0 oz    Comment: seldom - wine  . Drug use: No    Interim medical history since last visit reviewed. Allergies and medications reviewed  Review of Systems Per HPI unless specifically indicated above     Objective:    BP 122/78   Pulse 100   Temp 98.3 F (36.8 C) (Oral)   Resp 16   Wt 283 lb 3.2 oz (128.5 kg)   LMP 01/02/2015   SpO2 95%   BMI 41.00 kg/m   Wt Readings from Last 3 Encounters:  04/12/17 283 lb 3.2 oz (128.5 kg)  03/07/17 283 lb 1.6 oz (128.4 kg)  02/08/16 270 lb 6.4 oz (122.7 kg)    Physical Exam  Constitutional:  Morbidly obese female; appears to not feel well but nontoxic  Cardiovascular: Normal rate and regular rhythm.  Pulmonary/Chest: Effort normal and breath sounds normal. No respiratory distress. She has no wheezes.  Lymphadenopathy:    She has no cervical adenopathy.  Skin: She is not  diaphoretic. No pallor.  Psychiatric: Her mood appears not anxious. She does not exhibit a depressed mood.    Results for orders placed or performed in visit on 04/12/17  POCT Influenza A/B  Result Value Ref Range   Influenza A, POC Negative Negative   Influenza B, POC Negative Negative      Assessment & Plan:   Problem List Items Addressed This Visit      Other   Leukopenia (Chronic)    Evaluated by hematologist; likely benign neutropenia; patient declined referral back; will just monitor periodically; discussed neutropenic precautions with her, citing increased risk of bacterial infections       Other Visit Diagnoses    Body aches    -  Primary   flu testing done today, negative for A and B; rest, hydration   Relevant Orders   POCT Influenza A/B (Completed)   Acute non-recurrent maxillary sinusitis       start antibiotics; suspect secondary bacterial infection after initial viral infection   Relevant Medications   amoxicillin-clavulanate (AUGMENTIN) 875-125 MG tablet       Follow up plan: Return if symptoms worsen or fail to improve.  An after-visit summary was printed and given to the patient at [place].  Please see the patient instructions which may contain other information and recommendations beyond what is mentioned above in the assessment and plan.  Meds ordered this encounter  Medications  . amoxicillin-clavulanate (AUGMENTIN) 875-125 MG tablet    Sig: Take 1 tablet by mouth 2 (two) times daily for 10 days.    Dispense:  20 tablet    Refill:  0    Orders Placed This Encounter  Procedures  . POCT Influenza A/B

## 2017-04-12 NOTE — Patient Instructions (Addendum)
Infection risks and prevention Neutropenia can lead to life-threatening infections. Generally, the longer the neutropenia lasts and the more severe it is, the more likely the patient will develop an infection. The [facility] has a grading scale correlating a patient's ANC and the risk of infection. (See Determining the risk of infection in pdf format by clicking the download now button.) The single most important preventive measure is hand washing. Before any contact with a neutropenic patient, caregivers and others should wash their hands. Other preventive measures have been tried, but there's little evidence to support their use. However, many of these practices remain in place, so follow your institution's guidelines. Infections often develop from endogenous bacteria, so patients should maintain good personal hygiene, including hand washing and oral care. Patients should avoid crowds and others who are ill. Avoiding uncooked meats, seafood, eggs, and unwashed fruits and vegetables may be prudent, though the effectiveness hasn't been established.  Try to use PLAIN allergy medicine without the decongestant Avoid: phenylephrine, phenylpropanolamine, and pseudoephredine   Sinusitis, Adult Sinusitis is soreness and inflammation of your sinuses. Sinuses are hollow spaces in the bones around your face. They are located:  Around your eyes.  In the middle of your forehead.  Behind your nose.  In your cheekbones.  Your sinuses and nasal passages are lined with a stringy fluid (mucus). Mucus normally drains out of your sinuses. When your nasal tissues get inflamed or swollen, the mucus can get trapped or blocked so air cannot flow through your sinuses. This lets bacteria, viruses, and funguses grow, and that leads to infection. Follow these instructions at home: Medicines  Take, use, or apply over-the-counter and prescription medicines only as told by your doctor. These may include  nasal sprays.  If you were prescribed an antibiotic medicine, take it as told by your doctor. Do not stop taking the antibiotic even if you start to feel better. Hydrate and Humidify  Drink enough water to keep your pee (urine) clear or pale yellow.  Use a cool mist humidifier to keep the humidity level in your home above 50%.  Breathe in steam for 10-15 minutes, 3-4 times a day or as told by your doctor. You can do this in the bathroom while a hot shower is running.  Try not to spend time in cool or dry air. Rest  Rest as much as possible.  Sleep with your head raised (elevated).  Make sure to get enough sleep each night. General instructions  Put a warm, moist washcloth on your face 3-4 times a day or as told by your doctor. This will help with discomfort.  Wash your hands often with soap and water. If there is no soap and water, use hand sanitizer.  Do not smoke. Avoid being around people who are smoking (secondhand smoke).  Keep all follow-up visits as told by your doctor. This is important. Contact a doctor if:  You have a fever.  Your symptoms get worse.  Your symptoms do not get better within 10 days. Get help right away if:  You have a very bad headache.  You cannot stop throwing up (vomiting).  You have pain or swelling around your face or eyes.  You have trouble seeing.  You feel confused.  Your neck is stiff.  You have trouble breathing. This information is not intended to replace advice given to you by your health care provider. Make sure you discuss any questions you have with your health care provider. Document Released: 07/26/2007  Document Revised: 10/03/2015 Document Reviewed: 12/02/2014 Elsevier Interactive Patient Education  [employer].

## 2017-04-17 NOTE — Assessment & Plan Note (Signed)
Evaluated by hematologist; likely benign neutropenia; patient declined referral back; will just monitor periodically; discussed neutropenic precautions with her, citing increased risk of bacterial infections

## 2017-05-08 ENCOUNTER — Encounter: Payer: Self-pay | Admitting: Family Medicine

## 2017-05-09 ENCOUNTER — Ambulatory Visit: Payer: 59 | Admitting: Family Medicine

## 2017-05-09 ENCOUNTER — Encounter: Payer: Self-pay | Admitting: Family Medicine

## 2017-05-09 VITALS — BP 130/90 | HR 115 | Temp 98.0°F | Resp 14 | Ht 69.0 in | Wt 279.5 lb

## 2017-05-09 DIAGNOSIS — R197 Diarrhea, unspecified: Secondary | ICD-10-CM | POA: Diagnosis not present

## 2017-05-09 DIAGNOSIS — R1084 Generalized abdominal pain: Secondary | ICD-10-CM | POA: Diagnosis not present

## 2017-05-09 MED ORDER — HYOSCYAMINE SULFATE SL 0.125 MG SL SUBL
1.0000 | SUBLINGUAL_TABLET | Freq: Four times a day (QID) | SUBLINGUAL | 0 refills | Status: DC | PRN
Start: 2017-05-09 — End: 2019-06-04

## 2017-05-09 MED ORDER — METRONIDAZOLE 500 MG PO TABS: 500.0000 mg | ORAL_TABLET | Freq: Three times a day (TID) | ORAL | 0 refills | Status: DC

## 2017-05-09 NOTE — Addendum Note (Signed)
Addended by: Hubbard Hartshorn on: 05/09/2017 03:47 PM   Modules accepted: Orders

## 2017-05-09 NOTE — Patient Instructions (Signed)
Dehydration, Adult Dehydration is a condition in which there is not enough fluid or water in the body. This happens when you lose more fluids than you take in. Important organs, such as the kidneys, brain, and heart, cannot function without a proper amount of fluids. Any loss of fluids from the body can lead to dehydration. Dehydration can range from mild to severe. This condition should be treated right away to prevent it from becoming severe. What are the causes? This condition may be caused by:  Vomiting.  Diarrhea.  Excessive sweating, such as from heat exposure or exercise.  Not drinking enough fluid, especially: ? When ill. ? While doing activity that requires a lot of energy.  Excessive urination.  Fever.  Infection.  Certain medicines, such as medicines that cause the body to lose excess fluid (diuretics).  Inability to access safe drinking water.  Reduced physical ability to get adequate water and food.  What increases the risk? This condition is more likely to develop in people:  Who have a poorly controlled long-term (chronic) illness, such as diabetes, heart disease, or kidney disease.  Who are age 65 or older.  Who are disabled.  Who live in a place with high altitude.  Who play endurance sports.  What are the signs or symptoms? Symptoms of mild dehydration may include:  Thirst.  Dry lips.  Slightly dry mouth.  Dry, warm skin.  Dizziness. Symptoms of moderate dehydration may include:  Very dry mouth.  Muscle cramps.  Dark urine. Urine may be the color of tea.  Decreased urine production.  Decreased tear production.  Heartbeat that is irregular or faster than normal (palpitations).  Headache.  Light-headedness, especially when you stand up from a sitting position.  Fainting (syncope). Symptoms of severe dehydration may include:  Changes in skin, such as: ? Cold and clammy skin. ? Blotchy (mottled) or pale skin. ? Skin that does  not quickly return to normal after being lightly pinched and released (poor skin turgor).  Changes in body fluids, such as: ? Extreme thirst. ? No tear production. ? Inability to sweat when body temperature is high, such as in hot weather. ? Very little urine production.  Changes in vital signs, such as: ? Weak pulse. ? Pulse that is more than 100 beats a minute when sitting still. ? Rapid breathing. ? Low blood pressure.  Other changes, such as: ? Sunken eyes. ? Cold hands and feet. ? Confusion. ? Lack of energy (lethargy). ? Difficulty waking up from sleep. ? Short-term weight loss. ? Unconsciousness. How is this diagnosed? This condition is diagnosed based on your symptoms and a physical exam. Blood and urine tests may be done to help confirm the diagnosis. How is this treated? Treatment for this condition depends on the severity. Mild or moderate dehydration can often be treated at home. Treatment should be started right away. Do not wait until dehydration becomes severe. Severe dehydration is an emergency and it needs to be treated in a hospital. Treatment for mild dehydration may include:  Drinking more fluids.  Replacing salts and minerals in your blood (electrolytes) that you may have lost. Treatment for moderate dehydration may include:  Drinking an oral rehydration solution (ORS). This is a drink that helps you replace fluids and electrolytes (rehydrate). It can be found at pharmacies and retail stores. Treatment for severe dehydration may include:  Receiving fluids through an IV tube.  Receiving an electrolyte solution through a feeding tube that is passed through your nose   and into your stomach (nasogastric tube, or NG tube).  Correcting any abnormalities in electrolytes.  Treating the underlying cause of dehydration. Follow these instructions at home:  If directed by your health care provider, drink an ORS: ? Make an ORS by following instructions on the  package. ? Start by drinking small amounts, about  cup (120 mL) every 5-10 minutes. ? Slowly increase how much you drink until you have taken the amount recommended by your health care provider.  Drink enough clear fluid to keep your urine clear or pale yellow. If you were told to drink an ORS, finish the ORS first, then start slowly drinking other clear fluids. Drink fluids such as: ? Water. Do not drink only water. Doing that can lead to having too little salt (sodium) in the body (hyponatremia). ? Ice chips. ? Fruit juice that you have added water to (diluted fruit juice). ? Low-calorie sports drinks.  Avoid: ? Alcohol. ? Drinks that contain a lot of sugar. These include high-calorie sports drinks, fruit juice that is not diluted, and soda. ? Caffeine. ? Foods that are greasy or contain a lot of fat or sugar.  Take over-the-counter and prescription medicines only as told by your health care provider.  Do not take sodium tablets. This can lead to having too much sodium in the body (hypernatremia).  Eat foods that contain a healthy balance of electrolytes, such as bananas, oranges, potatoes, tomatoes, and spinach.  Keep all follow-up visits as told by your health care provider. This is important. Contact a health care provider if:  You have abdominal pain that: ? Gets worse. ? Stays in one area (localizes).  You have a rash.  You have a stiff neck.  You are more irritable than usual.  You are sleepier or more difficult to wake up than usual.  You feel weak or dizzy.  You feel very thirsty.  You have urinated only a small amount of very dark urine over 6-8 hours. Get help right away if:  You have symptoms of severe dehydration.  You cannot drink fluids without vomiting.  Your symptoms get worse with treatment.  You have a fever.  You have a severe headache.  You have vomiting or diarrhea that: ? Gets worse. ? Does not go away.  You have blood or green matter  (bile) in your vomit.  You have blood in your stool. This may cause stool to look black and tarry.  You have not urinated in 6-8 hours.  You faint.  Your heart rate while sitting still is over 100 beats a minute.  You have trouble breathing. This information is not intended to replace advice given to you by your health care provider. Make sure you discuss any questions you have with your health care provider. Document Released: 02/06/2005 Document Revised: 09/03/2015 Document Reviewed: 04/02/2015 Elsevier Interactive Patient Education  2018 Elsevier Inc.  

## 2017-05-09 NOTE — Progress Notes (Signed)
Name: Sally Lewis   MRN: [id number]    DOB: 04/17/1965   Date:05/09/2017       Progress Note  Subjective  Chief Complaint  Chief Complaint  Patient presents with  . Abdominal Pain  . c. diff    Symptoms include nausea, frequent bowel movements every 30 minutes to 1 hour, loss of appetite, fatigue, cramping and weakness x 2 days.    HPI  Abdominal pain: she states she took Amoxicillin for 10 days and finished medication two weeks ago, she was feeling well, however two days ago developed loose stools , bristol scale of 6 and abdominal cramping that is intense prior to bowel movements, and resolves after she defecates. Episodes about every hour yesterday including episodes during the night and today even more frequent than that. She has episodes of nausea with the pain, no vomiting. Lack of appetite and does not feel like drinking fluids. She denies a fever but has chills. No sick contact at home, she states used to work at [employer] and states it smells like c. Difi. She is tired and feeling weak.    Patient Active Problem List   Diagnosis Date Noted  . History of uterine fibroid 01/01/2015  . Anemia 10/18/2014  . Vitamin D deficiency 10/13/2014  . Leukopenia 10/13/2014  . Knee osteoarthritis 10/13/2014  . Obesity (BMI 30-39.9) 10/13/2014  . B12 deficiency 10/13/2014  . Liver hemangioma 10/13/2014    Past Surgical History:  Procedure Laterality Date  . BILATERAL SALPINGECTOMY Bilateral 01/04/2015   Procedure: BILATERAL SALPINGECTOMY;  Surgeon: [name], MD;  Location: [facility] ORS;  Service: Gynecology;  Laterality: Bilateral;  . CYSTOSCOPY N/A 01/04/2015   Procedure: CYSTOSCOPY;  Surgeon: [name], MD;  Location: [facility] ORS;  Service: Gynecology;  Laterality: N/A;  . MOUTH SURGERY    . ROBOTIC ASSISTED TOTAL HYSTERECTOMY N/A 01/04/2015   Procedure: ROBOTIC ASSISTED TOTAL HYSTERECTOMY;  Surgeon: [name], MD;  Location: [facility];  Service: Gynecology;  Laterality: N/A;  .  TUBAL LIGATION      Family History  Problem Relation Age of Onset  . Hypertension Mother   . Cancer Father        Prostate  . Sickle cell anemia Sister   . Sickle cell anemia Brother   . Breast cancer Neg Hx     Social History   Socioeconomic History  . Marital status: Married    Spouse name: Kasandra Knudsen   . Number of children: 2  . Years of education: 5  . Highest education level: Not on file  Social Needs  . Financial resource strain: Not hard at all  . Food insecurity - worry: Never true  . Food insecurity - inability: Never true  . Transportation needs - medical: No  . Transportation needs - non-medical: No  Occupational History  . 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 Smoking status: Former Smoker    Packs/day: 0.50    Years: 4.00    Pack years: 2.00    Types: Cigarettes    Start date: 10/29/1983    Last attempt to quit: 10/29/1987    Years since quitting: 29.5  . Smokeless tobacco: Never Used  Substance and Sexual Activity  . Alcohol use: Yes    Alcohol/week: 0.0 oz    Comment: seldom - wine  . Drug use: No  . Sexual activity: Yes    Partners: Male  Birth control/protection: Surgical  Other Topics Concern  . Not on file  Social History Narrative   She is married, children are grown and married.   She finihed  her PhD May 2018  in health services   Teaching online and also at [employer]      Current Outpatient Medications:  .  cholecalciferol (VITAMIN D) 1000 units tablet, Take 1,000 Units by mouth daily., Disp: , Rfl:  .  Multiple Vitamin (MULTIVITAMIN) tablet, Take 1 tablet by mouth daily., Disp: , Rfl:  .  vitamin B-12 (CYANOCOBALAMIN) 1000 MCG tablet, Take 1,000 mcg by mouth daily., Disp: , Rfl:  .  metroNIDAZOLE (FLAGYL) 500 MG tablet, Take 1 tablet (500 mg total) by mouth 3 (three) times daily., Disp: 21 tablet, Rfl: 0  No Known Allergies   ROS  Ten systems  reviewed and is negative except as mentioned in HPI  Objective  Vitals:   05/09/17 1446 05/09/17 1458  BP: 140/90 130/90  Pulse: (!) 115   Resp: 14   Temp: 98 F (36.7 C)   TempSrc: Oral   SpO2: 98%   Weight: 279 lb 8 oz (126.8 kg)   Height: 5\' 9"  (1.753 m)     Body mass index is 41.27 kg/m.  Physical Exam  Constitutional: Patient appears well-developed and well-nourished. Obese No distress.  HEENT: head atraumatic, normocephalic, pupils equal and reactive to light,  neck supple, throat within normal limits Cardiovascular: Normal rate, regular rhythm and normal heart sounds.  No murmur heard. No BLE edema. Pulmonary/Chest: Effort normal and breath sounds normal. No respiratory distress. Abdominal: Soft.  There is no tenderness., normal bowel sounds, refused rectal exam Psychiatric: Patient has a normal mood and affect. behavior is normal. Judgment and thought content normal.  Recent Results (from the past 2160 hour(s))  COMPLETE METABOLIC PANEL WITH GFR     Status: None   Collection Time: 03/07/17 11:00 AM  Result Value Ref Range   Glucose, Bld 82 65 - 99 mg/dL    Comment: .            Fasting reference interval .    BUN 10 7 - 25 mg/dL   Creat 1.03 0.50 - 1.05 mg/dL    Comment: For patients >67 years of age, the reference limit for Creatinine is approximately 13% higher for people identified as African-American. .    GFR, Est Non African American 62 > OR = 60 mL/min/1.56m2   GFR, Est African American 72 > OR = 60 mL/min/1.85m2   BUN/Creatinine Ratio NOT APPLICABLE 6 - 22 (calc)   Sodium 141 135 - 146 mmol/L   Potassium 4.7 3.5 - 5.3 mmol/L   Chloride 107 98 - 110 mmol/L   CO2 29 20 - 32 mmol/L   Calcium 10.4 8.6 - 10.4 mg/dL   Total Protein 7.1 6.1 - 8.1 g/dL   Albumin 4.2 3.6 - 5.1 g/dL   Globulin 2.9 1.9 - 3.7 g/dL (calc)   AG Ratio 1.4 1.0 - 2.5 (calc)   Total Bilirubin 0.6 0.2 - 1.2 mg/dL   Alkaline phosphatase (APISO) 72 33 - 130 U/L   AST 20 10 - 35  U/L   ALT 18 6 - 29 U/L  Lipid panel     Status: None   Collection Time: 03/07/17 11:00 AM  Result Value Ref Range   Cholesterol 178 <200 mg/dL   HDL 64 >50 mg/dL   Triglycerides 78 <150 mg/dL   LDL Cholesterol (Calc) 97 mg/dL (calc)  Comment: Reference range: <100 . Desirable range <100 mg/dL for primary prevention;   <70 mg/dL for patients with CHD or diabetic patients  with > or = 2 CHD risk factors. Marland Kitchen LDL-C is now calculated using the Martin-Hopkins  calculation, which is a validated novel method providing  better accuracy than the Friedewald equation in the  estimation of LDL-C.  Cresenciano Genre et al. Annamaria Helling. 9767;341(93): 2061-2068  (http://education.[facility].com/faq/FAQ164)    Total CHOL/HDL Ratio 2.8 <5.0 (calc)   Non-HDL Cholesterol (Calc) 114 <130 mg/dL (calc)    Comment: For patients with diabetes plus 1 major ASCVD risk  factor, treating to a non-HDL-C goal of <100 mg/dL  (LDL-C of <70 mg/dL) is considered a therapeutic  option.   Hemoglobin A1c     Status: None   Collection Time: 03/07/17 11:00 AM  Result Value Ref Range   Hgb A1c MFr Bld 5.2 <5.7 % of total Hgb    Comment: For the purpose of screening for the presence of diabetes: . <5.7%       Consistent with the absence of diabetes 5.7-6.4%    Consistent with increased risk for diabetes             (prediabetes) > or =6.5%  Consistent with diabetes . This assay result is consistent with a decreased risk of diabetes. . Currently, no consensus exists regarding use of hemoglobin A1c for diagnosis of diabetes in children. . According to American Diabetes Association (ADA) guidelines, hemoglobin A1c <7.0% represents optimal control in non-pregnant diabetic patients. Different metrics may apply to specific patient populations.  Standards of Medical Care in Diabetes(ADA). .    Mean Plasma Glucose 103 (calc)   eAG (mmol/L) 5.7 (calc)  CBC with Differential/Platelet     Status: Abnormal   Collection  Time: 03/07/17 11:00 AM  Result Value Ref Range   WBC 2.3 (L) 3.8 - 10.8 Thousand/uL   RBC 4.48 3.80 - 5.10 Million/uL   Hemoglobin 13.4 11.7 - 15.5 g/dL   HCT 39.8 35.0 - 45.0 %   MCV 88.8 80.0 - 100.0 fL   MCH 29.9 27.0 - 33.0 pg   MCHC 33.7 32.0 - 36.0 g/dL   RDW 12.6 11.0 - 15.0 %   Platelets 177 140 - 400 Thousand/uL   MPV 10.6 7.5 - 12.5 fL   Neutro Abs 840 (L) 1,500 - 7,800 cells/uL   Lymphs Abs 1,083 850 - 3,900 cells/uL   WBC mixed population 338 200 - 950 cells/uL   Eosinophils Absolute 30 15 - 500 cells/uL   Basophils Absolute 9 0 - 200 cells/uL   Neutrophils Relative % 36.5 %   Total Lymphocyte 47.1 %   Monocytes Relative 14.7 %   Eosinophils Relative 1.3 %   Basophils Relative 0.4 %  Vitamin B12     Status: None   Collection Time: 03/07/17 11:00 AM  Result Value Ref Range   Vitamin B-12 747 200 - 1,100 pg/mL  VITAMIN D 25 Hydroxy (Vit-D Deficiency, Fractures)     Status: Abnormal   Collection Time: 03/07/17 11:00 AM  Result Value Ref Range   Vit D, 25-Hydroxy 23 (L) 30 - 100 ng/mL    Comment: Vitamin D Status         25-OH Vitamin D: . Deficiency:                    <20 ng/mL Insufficiency:             20 - 29 ng/mL Optimal:                 >  or = 30 ng/mL . For 25-OH Vitamin D testing on patients on  D2-supplementation and patients for whom quantitation  of D2 and D3 fractions is required, the [facility](TM) 25-OH VIT D, (D2,D3), LC/MS/MS is recommended: order  code [id number] (patients >74yrs). . For more information on this test, go to: http://education.[facility].com/faq/FAQ163 (This link is being provided for  informational/educational purposes only.)   Insulin, random     Status: None   Collection Time: 03/07/17 11:00 AM  Result Value Ref Range   Insulin 4.3 2.0 - 19.6 uIU/mL    Comment: This insulin assay shows strong cross-reactivity for some insulin analogs (lispro, aspart, and glargine) and much lower cross-reactivity with others  (detemir, glulisine).   POCT Influenza A/B     Status: Normal   Collection Time: 04/12/17  1:50 PM  Result Value Ref Range   Influenza A, POC Negative Negative   Influenza B, POC Negative Negative     PHQ2/9: Depression screen [facility] 2/9 03/07/2017 02/08/2016 10/13/2014  Decreased Interest 2 0 0  Down, Depressed, Hopeless 0 0 0  PHQ - 2 Score 2 0 0  Altered sleeping 1 - -  Tired, decreased energy 0 - -  Change in appetite 0 - -  Feeling bad or failure about yourself  0 - -  Trouble concentrating 0 - -  Moving slowly or fidgety/restless 0 - -  Suicidal thoughts 0 - -  PHQ-9 Score 3 - -  Difficult doing work/chores Not difficult at all - -     Fall Risk: Fall Risk  05/09/2017 03/07/2017 02/08/2016 10/13/2014  Falls in the past year? No No No No    Functional Status Survey: Is the patient deaf or have difficulty hearing?: No Does the patient have difficulty seeing, even when wearing glasses/contacts?: No Does the patient have difficulty concentrating, remembering, or making decisions?: No Does the patient have difficulty walking or climbing stairs?: No Does the patient have difficulty dressing or bathing?: No Does the patient have difficulty doing errands alone such as visiting a doctor's office or shopping?: No    Assessment & Plan  1. Diarrhea, unspecified type  - CBC with Differential/Platelet - COMPLETE METABOLIC PANEL WITH GFR - Cdiff NAA+O+P+Stool Culture - Hyoscyamine Sulfate SL (LEVSIN/SL) 0.125 MG SUBL; Place 1 tablet under the tongue every 6 (six) hours as needed.  Dispense: 40 each; Refill: 0  2. Generalized abdominal pain  - CBC with Differential/Platelet - COMPLETE METABOLIC PANEL WITH GFR - Cdiff NAA+O+P+Stool Culture - Hyoscyamine Sulfate SL (LEVSIN/SL) 0.125 MG SUBL; Place 1 tablet under the tongue every 6 (six) hours as needed.  Dispense: 40 each; Refill: 0

## 2017-05-10 DIAGNOSIS — R1084 Generalized abdominal pain: Secondary | ICD-10-CM | POA: Diagnosis not present

## 2017-05-10 DIAGNOSIS — R197 Diarrhea, unspecified: Secondary | ICD-10-CM | POA: Diagnosis not present

## 2017-05-10 LAB — COMPLETE METABOLIC PANEL WITH GFR
AG RATIO: 1.4 (calc) (ref 1.0–2.5)
ALBUMIN MSPROF: 4.1 g/dL (ref 3.6–5.1)
ALKALINE PHOSPHATASE (APISO): 76 U/L (ref 33–130)
ALT: 22 U/L (ref 6–29)
AST: 17 U/L (ref 10–35)
BILIRUBIN TOTAL: 0.8 mg/dL (ref 0.2–1.2)
BUN: 7 mg/dL (ref 7–25)
CHLORIDE: 106 mmol/L (ref 98–110)
CO2: 27 mmol/L (ref 20–32)
Calcium: 10.2 mg/dL (ref 8.6–10.4)
Creat: 1.02 mg/dL (ref 0.50–1.05)
GFR, Est African American: 72 mL/min/{1.73_m2} (ref >=60)
GFR, Est Non African American: 62 mL/min/{1.73_m2} (ref >=60)
GLOBULIN: 3 g/dL (ref 1.9–3.7)
Glucose, Bld: 108 mg/dL — ABNORMAL HIGH (ref 65–99)
POTASSIUM: 3.9 mmol/L (ref 3.5–5.3)
SODIUM: 140 mmol/L (ref 135–146)
Total Protein: 7.1 g/dL (ref 6.1–8.1)

## 2017-05-10 LAB — CBC WITH DIFFERENTIAL/PLATELET
BASOS ABS: 7 {cells}/uL (ref 0–200)
Basophils Relative: 0.1 %
EOS ABS: 50 {cells}/uL (ref 15–500)
EOS PCT: 0.7 %
HEMATOCRIT: 41.6 % (ref 35.0–45.0)
HEMOGLOBIN: 14.4 g/dL (ref 11.7–15.5)
LYMPHS ABS: 817 {cells}/uL — AB (ref 850–3900)
MCH: 30.4 pg (ref 27.0–33.0)
MCHC: 34.6 g/dL (ref 32.0–36.0)
MCV: 87.9 fL (ref 80.0–100.0)
MPV: 11.1 fL (ref 7.5–12.5)
Monocytes Relative: 11.6 %
NEUTROS PCT: 76.1 %
Neutro Abs: 5403 cells/uL (ref 1500–7800)
Platelets: 202 10*3/uL (ref 140–400)
RBC: 4.73 10*6/uL (ref 3.80–5.10)
RDW: 12.8 % (ref 11.0–15.0)
Total Lymphocyte: 11.5 %
WBC mixed population: 824 cells/uL (ref 200–950)
WBC: 7.1 10*3/uL (ref 3.8–10.8)

## 2017-05-14 LAB — STOOL CULTURE
MICRO NUMBER: 90357461
MICRO NUMBER: 90357462
MICRO NUMBER:: 90357463
SHIGA RESULT:: NOT DETECTED
SPECIMEN QUALITY: ADEQUATE
SPECIMEN QUALITY:: ADEQUATE
SPECIMEN QUALITY:: ADEQUATE

## 2017-05-15 ENCOUNTER — Encounter: Payer: Self-pay | Admitting: Family Medicine

## 2017-05-15 ENCOUNTER — Ambulatory Visit: Payer: 59 | Admitting: Family Medicine

## 2017-05-17 LAB — C.DIFF TOXINB QL PCR: CLOSTRIDIUM DIFFICILE TOXINB,QL REAL TIME PCR: DETECTED — AB

## 2017-05-17 LAB — CLOSTRIDIUM DIFFICILE CULTURE-FECAL

## 2017-05-18 ENCOUNTER — Telehealth: Payer: Self-pay

## 2017-05-18 NOTE — Telephone Encounter (Signed)
[facility] called with stat labs report for Sally Lewis, but they hung up before the lab was reported. [facility] states that they can only be on hold for a small amount of time. I think they were calling in regards to her C.diff being positive. Please advise.

## 2017-05-21 ENCOUNTER — Encounter: Payer: Self-pay | Admitting: Family Medicine

## 2017-05-21 DIAGNOSIS — A0472 Enterocolitis due to Clostridium difficile, not specified as recurrent: Secondary | ICD-10-CM | POA: Insufficient documentation

## 2017-07-05 ENCOUNTER — Encounter: Payer: Self-pay | Admitting: Family Medicine

## 2018-05-28 ENCOUNTER — Encounter: Payer: Self-pay | Admitting: Family Medicine

## 2018-05-30 DIAGNOSIS — H00024 Hordeolum internum left upper eyelid: Secondary | ICD-10-CM | POA: Diagnosis not present

## 2018-10-25 ENCOUNTER — Other Ambulatory Visit: Payer: Self-pay | Admitting: Family Medicine

## 2018-12-09 ENCOUNTER — Other Ambulatory Visit: Payer: Self-pay | Admitting: Family Medicine

## 2018-12-09 DIAGNOSIS — Z1231 Encounter for screening mammogram for malignant neoplasm of breast: Secondary | ICD-10-CM

## 2019-02-24 ENCOUNTER — Other Ambulatory Visit: Payer: Self-pay | Admitting: Family Medicine

## 2019-02-24 ENCOUNTER — Ambulatory Visit
Admission: RE | Admit: 2019-02-24 | Discharge: 2019-02-24 | Disposition: A | Discharge status: Home / Self Care | Payer: 59 | Source: Ambulatory Visit | Attending: Family Medicine | Admitting: Family Medicine

## 2019-02-24 ENCOUNTER — Other Ambulatory Visit: Payer: Self-pay

## 2019-02-24 DIAGNOSIS — R928 Other abnormal and inconclusive findings on diagnostic imaging of breast: Secondary | ICD-10-CM

## 2019-02-24 DIAGNOSIS — N6489 Other specified disorders of breast: Secondary | ICD-10-CM

## 2019-02-24 DIAGNOSIS — Z1231 Encounter for screening mammogram for malignant neoplasm of breast: Secondary | ICD-10-CM | POA: Insufficient documentation

## 2019-02-24 HISTORY — PX: BREAST BIOPSY: SHX20

## 2019-02-26 ENCOUNTER — Other Ambulatory Visit: Payer: Self-pay | Admitting: Family Medicine

## 2019-02-26 DIAGNOSIS — R928 Other abnormal and inconclusive findings on diagnostic imaging of breast: Secondary | ICD-10-CM

## 2019-03-06 ENCOUNTER — Other Ambulatory Visit: Payer: Self-pay | Admitting: Family Medicine

## 2019-03-06 ENCOUNTER — Ambulatory Visit
Admission: RE | Admit: 2019-03-06 | Discharge: 2019-03-06 | Disposition: A | Discharge status: Home / Self Care | Payer: 59 | Source: Ambulatory Visit | Attending: Family Medicine | Admitting: Family Medicine

## 2019-03-06 DIAGNOSIS — N6489 Other specified disorders of breast: Secondary | ICD-10-CM | POA: Insufficient documentation

## 2019-03-06 DIAGNOSIS — N6320 Unspecified lump in the left breast, unspecified quadrant: Secondary | ICD-10-CM

## 2019-03-06 DIAGNOSIS — R928 Other abnormal and inconclusive findings on diagnostic imaging of breast: Secondary | ICD-10-CM

## 2019-03-14 ENCOUNTER — Other Ambulatory Visit: Payer: Self-pay | Admitting: Family Medicine

## 2019-03-14 ENCOUNTER — Ambulatory Visit
Admission: RE | Admit: 2019-03-14 | Discharge: 2019-03-14 | Disposition: A | Discharge status: Home / Self Care | Payer: 59 | Source: Ambulatory Visit | Attending: Family Medicine | Admitting: Family Medicine

## 2019-03-14 ENCOUNTER — Other Ambulatory Visit: Payer: Self-pay

## 2019-03-14 DIAGNOSIS — N6320 Unspecified lump in the left breast, unspecified quadrant: Secondary | ICD-10-CM | POA: Insufficient documentation

## 2019-03-14 DIAGNOSIS — R928 Other abnormal and inconclusive findings on diagnostic imaging of breast: Secondary | ICD-10-CM

## 2019-03-17 LAB — SURGICAL PATHOLOGY

## 2019-05-09 ENCOUNTER — Ambulatory Visit: Payer: 59 | Attending: Internal Medicine

## 2019-05-09 ENCOUNTER — Other Ambulatory Visit: Payer: Self-pay

## 2019-05-09 DIAGNOSIS — Z23 Encounter for immunization: Secondary | ICD-10-CM

## 2019-05-09 NOTE — Progress Notes (Signed)
   Covid-19 Vaccination Clinic  Name:  Sally Lewis    MRN: [id number] DOB: [birth year]  05/09/2019  Sally Lewis was observed post Covid-19 immunization for 15 minutes without incident. She was provided with Vaccine Information Sheet and instruction to access the V-Safe system.   Sally Lewis was instructed to call 911 with any severe reactions post vaccine: Marland Kitchen Difficulty breathing  . Swelling of face and throat  . A fast heartbeat  . A bad rash all over body  . Dizziness and weakness   Immunizations Administered    Name Date Dose VIS Date Route   Pfizer COVID-19 Vaccine 05/09/2019  3:21 PM 0.3 mL 01/31/2019 Intramuscular   Manufacturer: [place]   Lot: [id number]   [place]: [id number]

## 2019-06-02 ENCOUNTER — Ambulatory Visit: Payer: 59 | Attending: Internal Medicine

## 2019-06-02 DIAGNOSIS — Z23 Encounter for immunization: Secondary | ICD-10-CM

## 2019-06-02 NOTE — Progress Notes (Signed)
   Covid-19 Vaccination Clinic  Name:  DARRELYN LEGERE    MRN: [id number] DOB: June 12, 1965  06/02/2019  Ms. Vanderkolk was observed post Covid-19 immunization for 15 minutes without incident. She was provided with Vaccine Information Sheet and instruction to access the V-Safe system.   Ms. Sconce was instructed to call 911 with any severe reactions post vaccine: Marland Kitchen Difficulty breathing  . Swelling of face and throat  . A fast heartbeat  . A bad rash all over body  . Dizziness and weakness   Immunizations Administered    Name Date Dose VIS Date Route   Pfizer COVID-19 Vaccine 06/02/2019  3:21 PM 0.3 mL 01/31/2019 Intramuscular   Manufacturer: [place]   Lot: [id number]   [place]: [id number]

## 2019-06-04 ENCOUNTER — Encounter: Payer: Self-pay | Admitting: Family Medicine

## 2019-06-04 ENCOUNTER — Other Ambulatory Visit: Payer: Self-pay

## 2019-06-04 ENCOUNTER — Ambulatory Visit (INDEPENDENT_AMBULATORY_CARE_PROVIDER_SITE_OTHER): Payer: 59 | Admitting: Family Medicine

## 2019-06-04 DIAGNOSIS — Z0000 Encounter for general adult medical examination without abnormal findings: Secondary | ICD-10-CM

## 2019-06-04 DIAGNOSIS — E559 Vitamin D deficiency, unspecified: Secondary | ICD-10-CM | POA: Diagnosis not present

## 2019-06-04 DIAGNOSIS — E538 Deficiency of other specified B group vitamins: Secondary | ICD-10-CM | POA: Diagnosis not present

## 2019-06-04 DIAGNOSIS — M25361 Other instability, right knee: Secondary | ICD-10-CM | POA: Diagnosis not present

## 2019-06-04 DIAGNOSIS — Z862 Personal history of diseases of the blood and blood-forming organs and certain disorders involving the immune mechanism: Secondary | ICD-10-CM

## 2019-06-04 DIAGNOSIS — Z131 Encounter for screening for diabetes mellitus: Secondary | ICD-10-CM

## 2019-06-04 DIAGNOSIS — Z13220 Encounter for screening for lipoid disorders: Secondary | ICD-10-CM

## 2019-06-04 NOTE — Patient Instructions (Signed)

## 2019-06-04 NOTE — Progress Notes (Signed)
Name: Sally Lewis   MRN: [id number]    DOB: May 08, 1965   Date:06/04/2019       Progress Note  Subjective  Chief Complaint  Chief Complaint  Patient presents with  . Annual Exam    HPI  Patient presents for annual CPE and follow up  Morbid obesity: she has been obese after first pregnancy. Her weight prior to pregnancy was 150 lbs and after delivery only able to go back down to 180 lbs. She got pregnancy again and weigh continue to go up afterwards. Her heaviest weight is now . She wants help to lose weight. She has never taken prescription medication. She states at this time she is only cutting down on snacks but she is still drinking sweet tea and sodas daily . She will also check medication coverage with pharmacy. Denies personal history or family history of thyroid cancer, no personal of pancreatitis.   Knee instability: she has noticed that right knee locks on her when pivoting, occasionally has effusion, taking ibuprofen prn, having dull pain at night and sharp with movement. She has never seen ortho before. Started 3 weeks ago, she has been moving   Low vitamin and B12: stopped taking B12 but is still taking vitamin D daily   Diet: currently only portion control, spent time discussing low carb diet, importance of stopping sweet beverage Exercise: not currently  USPSTF grade A and B recommendations    Office Visit from 06/04/2019 in [facility]  AUDIT-C Score  0     Depression: Phq 9 is  negative Depression screen [facility] 2/9 06/04/2019 03/07/2017 02/08/2016 10/13/2014  Decreased Interest 0 2 0 0  Down, Depressed, Hopeless 0 0 0 0  PHQ - 2 Score 0 2 0 0  Altered sleeping 0 1 - -  Tired, decreased energy 0 0 - -  Change in appetite 0 0 - -  Feeling bad or failure about yourself  0 0 - -  Trouble concentrating 0 0 - -  Moving slowly or fidgety/restless 0 0 - -  Suicidal thoughts 0 0 - -  PHQ-9 Score 0 3 - -  Difficult doing work/chores - Not difficult at  all - -   Hypertension: BP Readings from Last 3 Encounters:  06/04/19 133/88  05/09/17 130/90  04/12/17 122/78   Obesity: Wt Readings from Last 3 Encounters:  06/04/19 296 lb 9.6 oz (134.5 kg)  05/09/17 279 lb 8 oz (126.8 kg)  04/12/17 283 lb 3.2 oz (128.5 kg)   BMI Readings from Last 3 Encounters:  06/04/19 44.44 kg/m  05/09/17 41.27 kg/m  04/12/17 41.00 kg/m     Hep C Screening: up to date  STD testing and prevention (HIV/chl/gon/syphilis): Not interested  Intimate partner violence: negative screen  Sexual History (Partners/Practices/Protection from [employer] hx STI/Pregnancy Plans):married one partner  Pain during Intercourse: some dryness but okay with lube  Menstrual History/LMP/Abnormal Bleeding: s/p hysterectomy  Incontinence Symptoms: she has noticed some leakage when sitting for a long time, advised timer to get up and void every 3 hours or less, and if no improvement PT referral   Breast cancer:  - Last Mammogram: up to date - BRCA gene screening: N/A  Osteoporosis: Discussed high calcium and vitamin D supplementation, weight bearing exercises  Cervical cancer screening: s/p hysterectomy   Skin cancer: Discussed monitoring for atypical lesions  Colorectal cancer: repeat in 2025  Lung cancer:  Low Dose CT Chest recommended if Age 60-80 years, 30 pack-year currently smoking  OR have quit w/in 15years. Patient does not qualify.   ECG: 2019   Advanced Care Planning: A voluntary discussion about advance care planning including the explanation and discussion of advance directives.  Discussed health care proxy and Living will, and the patient was able to identify a health care proxy as husband .  Patient does not have a living will at present time. If patient does have living will, I have requested they bring this to the clinic to be scanned in to their chart.  Lipids: Lab Results  Component Value Date   CHOL 178 03/07/2017   CHOL 190 02/09/2016   CHOL 158  10/14/2014   Lab Results  Component Value Date   HDL 64 03/07/2017   HDL 53 02/09/2016   HDL 53 10/14/2014   Lab Results  Component Value Date   LDLCALC 97 03/07/2017   LDLCALC 109 02/09/2016   LDLCALC 91 10/14/2014   Lab Results  Component Value Date   TRIG 78 03/07/2017   TRIG 140 02/09/2016   TRIG 71 10/14/2014   Lab Results  Component Value Date   CHOLHDL 2.8 03/07/2017   CHOLHDL 3.0 10/14/2014   No results found for: LDLDIRECT  Glucose: Glucose, Bld  Date Value Ref Range Status  05/09/2017 108 (H) 65 - 99 mg/dL Final    Comment:    .            Fasting reference interval . For someone without known diabetes, a glucose value between 100 and 125 mg/dL is consistent with prediabetes and should be confirmed with a follow-up test. .   03/07/2017 82 65 - 99 mg/dL Final    Comment:    .            Fasting reference interval .   01/01/2015 86 65 - 99 mg/dL Final    Patient Active Problem List   Diagnosis Date Noted  . Clostridium difficile diarrhea 05/21/2017  . History of uterine fibroid 01/01/2015  . Anemia 10/18/2014  . Vitamin D deficiency 10/13/2014  . Leukopenia 10/13/2014  . Knee osteoarthritis 10/13/2014  . Obesity (BMI 30-39.9) 10/13/2014  . B12 deficiency 10/13/2014  . Liver hemangioma 10/13/2014    Past Surgical History:  Procedure Laterality Date  . ABDOMINAL HYSTERECTOMY    . BILATERAL SALPINGECTOMY Bilateral 01/04/2015   Procedure: BILATERAL SALPINGECTOMY;  Surgeon: [name], MD;  Location: [facility] ORS;  Service: Gynecology;  Laterality: Bilateral;  . CYSTOSCOPY N/A 01/04/2015   Procedure: CYSTOSCOPY;  Surgeon: [name], MD;  Location: [facility] ORS;  Service: Gynecology;  Laterality: N/A;  . MOUTH SURGERY    . ROBOTIC ASSISTED TOTAL HYSTERECTOMY N/A 01/04/2015   Procedure: ROBOTIC ASSISTED TOTAL HYSTERECTOMY;  Surgeon: [name], MD;  Location: [facility] ORS;  Service: Gynecology;  Laterality: N/A;  . TUBAL LIGATION      Family  History  Problem Relation Age of Onset  . Hypertension Mother   . Cancer Father        Prostate  . Sickle cell anemia Sister   . Sickle cell anemia Brother   . Breast cancer Neg Hx     Social History   Socioeconomic History  . Marital status: Married    Spouse name: Kasandra Knudsen   . Number of children: 2  . Years of education: 67  . Highest education level: Not on file  Occupational History  . 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 Smoking status: Former Smoker    Packs/day: 0.50    Years: 4.00    Pack years: 2.00    Types: Cigarettes    Start date: 10/29/1983    Quit date: 10/29/1987    Years since quitting: 31.6  . Smokeless tobacco: Never Used  Substance and Sexual Activity  . Alcohol use: Yes    Alcohol/week: 0.0 standard drinks    Comment: seldom - wine  . Drug use: No  . Sexual activity: Yes    Partners: Male    Birth control/protection: Surgical  Other Topics Concern  . Not on file  Social History Narrative   She is married, children are grown and married.   She finihed  her PhD May 2018  in health services   Teaching online and also at [employer] Strain: Low Risk   . Difficulty of Paying Living Expenses: Not hard at all  Food Insecurity: No Food Insecurity  . Worried About Charity fundraiser in the Last Year: Never true  . Ran Out of Food in the Last Year: Never true  Transportation Needs: No Transportation Needs  . Lack of Transportation (Medical): No  . Lack of Transportation (Non-Medical): No  Physical Activity: Inactive  . Days of Exercise per Week: 0 days  . Minutes of Exercise per Session: 0 min  Stress: No Stress Concern Present  . Feeling of Stress : Not at all  Social Connections: Slightly Isolated  . Frequency of Communication with Friends and Family: More than three times a week  . Frequency of Social  Gatherings with Friends and Family: Once a week  . Attends Religious Services: 1 to 4 times per year  . Active Member of Clubs or Organizations: No  . Attends Archivist Meetings: Never  . Marital Status: Married  Human resources officer Violence: Not At Risk  . Fear of Current or Ex-Partner: No  . Emotionally Abused: No  . Physically Abused: No  . Sexually Abused: No     Current Outpatient Medications:  .  cholecalciferol (VITAMIN D) 1000 units tablet, Take 1,000 Units by mouth daily., Disp: , Rfl:  .  Multiple Vitamin (MULTIVITAMIN) tablet, Take 1 tablet by mouth daily., Disp: , Rfl:  .  vitamin B-12 (CYANOCOBALAMIN) 1000 MCG tablet, Take 1,000 mcg by mouth daily., Disp: , Rfl:   No Known Allergies   ROS  Constitutional: Negative for fever, positive for weight change.  Respiratory: Negative for cough and shortness of breath.   Cardiovascular: Negative for chest pain or palpitations.  Gastrointestinal: Negative for abdominal pain, no bowel changes.  Musculoskeletal: positive for gait problem and intermittent  joint swelling.  Skin: Negative for rash.  Neurological: Negative for dizziness or headache.  No other specific complaints in a complete review of systems (except as listed in HPI above).  Objective  Vitals:   06/04/19 1008  BP: 133/88  Pulse: (!) 106  Resp: 16  Temp: (!) 97.1 F (36.2 C)  TempSrc: Temporal  SpO2: 96%  Weight: 296 lb 9.6 oz (134.5 kg)  Height: 5' 8.5" (1.74 m)    Body mass index is 44.44 kg/m.  Physical Exam  Constitutional: Patient appears well-developed and well-nourished. No distress.  HENT: Head: Normocephalic and atraumatic. Ears: B TMs ok, no erythema or effusion; Nose: Nose not done. Mouth/Throat: not done Eyes: Conjunctivae and EOM are normal. Pupils are equal, round, and reactive to light. No  scleral icterus.  Neck: Normal range of motion. Neck supple. No JVD present. No thyromegaly present.  Cardiovascular: Normal rate,  regular rhythm and normal heart sounds.  No murmur heard. No BLE edema. Pulmonary/Chest: Effort normal and breath sounds normal. No respiratory distress. Abdominal: Soft. Bowel sounds are normal, no distension. There is no tenderness. no masses Breast: no lumps or masses, no nipple discharge or rashes FEMALE GENITALIA:  Not done RECTAL:not done  Musculoskeletal: Normal range of motion, no joint effusions. No gross deformities Neurological: he is alert and oriented to person, place, and time. No cranial nerve deficit. Coordination, balance, strength, speech and gait are normal.  Skin: Skin is warm and dry. No rash noted. No erythema.  Psychiatric: Patient has a normal mood and affect. behavior is normal. Judgment and thought content normal.  Recent Results (from the past 2160 hour(s))  Surgical pathology     Status: None   Collection Time: 03/14/19  8:30 AM  Result Value Ref Range   SURGICAL PATHOLOGY      SURGICAL PATHOLOGY CASE: [id number] PATIENT: [facility] Surgical Pathology Report     Specimen Submitted: A. Breast, left 11:00  Clinical History: Left br 11 oc mass-? FCC, ruleout cancer. Coil shaped marker placed following ultrasound guided biopsy of LEFT breast at 11 o'clock, 5 cm from the nipple.    DIAGNOSIS: A.  BREAST, LEFT AT 11:00, 5 CM FROM THE NIPPLE; ULTRASOUND-GUIDED CORE BIOPSY: - BENIGN MAMMARY PARENCHYMA WITH FIBROCYSTIC CHANGES. - NEGATIVE FOR ATYPICAL PROLIFERATIVE BREAST DISEASE.  GROSS DESCRIPTION: A. Labeled: Ultrasound-guided left breast core biopsies at 11 o'clock position and 5 cm from nipple Received: In formalin Time/date in fixative: Tissue procedure time 8:30 AM, tissue put in formalin time 8:30 AM on 03/14/2019 Cold ischemic time: Less than 1 minute Total fixation time: 9 hours Core pieces: Multiple Size: 0.8 x 0.6 x 0.1 cm in aggregate Description: Fibrofatty soft tissue fragments Ink color: Blue Entirely submitted in  1  cassette.    Final Diagnosis performed by Allena Napoleon, MD.   Electronically signed 03/17/2019 10:17:34AM The electronic signature indicates that the named Attending Pathologist has evaluated the specimen Technical component performed at [facility], [address], [place], [place] [address] Lab: [phone] Dir: Rush Farmer, MD, MMM  Professional component performed at [facility], [facility], [place], [place], [place] [address] Lab: [phone] Dir: Dellia Nims. Rubinas, MD       Fall Risk: Fall Risk  06/04/2019 05/09/2017 03/07/2017 02/08/2016 10/13/2014  Falls in the past year? 0 No No No No  Number falls in past yr: 0 - - - -  Injury with Fall? 0 - - - -     Functional Status Survey: Is the patient deaf or have difficulty hearing?: No Does the patient have difficulty seeing, even when wearing glasses/contacts?: No Does the patient have difficulty concentrating, remembering, or making decisions?: No Does the patient have difficulty walking or climbing stairs?: No Does the patient have difficulty dressing or bathing?: No Does the patient have difficulty doing errands alone such as visiting a doctor's office or shopping?: No   Assessment & Plan  1. Well adult health check  - CBC with Differential/Platelet  2. Morbid obesity ([place])  Discussed with the patient the risk posed by an increased BMI. Discussed importance of portion control, calorie counting and at least 150 minutes of physical activity weekly. Avoid sweet beverages and drink more water. Eat at least 6 servings of fruit and vegetables daily   3. Knee  instability, right  - Ambulatory referral to Orthopedic Surgery  4. Vitamin D deficiency  - VITAMIN D 25 Hydroxy (Vit-D Deficiency, Fractures)  5. B12 deficiency  - Vitamin B12  6. Diabetes mellitus screening  - Hemoglobin A1c  7. History of anemia  - COMPLETE METABOLIC PANEL WITH GFR  8. Lipid screening  - Lipid panel  -USPSTF  grade A and B recommendations reviewed with patient; age-appropriate recommendations, preventive care, screening tests, etc discussed and encouraged; healthy living encouraged; see AVS for patient education given to patient -Discussed importance of 150 minutes of physical activity weekly, eat two servings of fish weekly, eat one serving of tree nuts ( cashews, pistachios, pecans, almonds.Marland Kitchen) every other day, eat 6 servings of fruit/vegetables daily and drink plenty of water and avoid sweet beverages.

## 2019-06-05 LAB — CBC WITH DIFFERENTIAL/PLATELET
Absolute Monocytes: 412 cells/uL (ref 200–950)
Basophils Absolute: 9 cells/uL (ref 0–200)
Basophils Relative: 0.4 %
Eosinophils Absolute: 39 cells/uL (ref 15–500)
Eosinophils Relative: 1.7 %
HCT: 38 % (ref 35.0–45.0)
Hemoglobin: 12.9 g/dL (ref 11.7–15.5)
Lymphs Abs: 1003 cells/uL (ref 850–3900)
MCH: 31.2 pg (ref 27.0–33.0)
MCHC: 33.9 g/dL (ref 32.0–36.0)
MCV: 92 fL (ref 80.0–100.0)
MPV: 10.1 fL (ref 7.5–12.5)
Monocytes Relative: 17.9 %
Neutro Abs: 837 cells/uL — ABNORMAL LOW (ref 1500–7800)
Neutrophils Relative %: 36.4 %
Platelets: 196 10*3/uL (ref 140–400)
RBC: 4.13 10*6/uL (ref 3.80–5.10)
RDW: 12.9 % (ref 11.0–15.0)
Total Lymphocyte: 43.6 %
WBC: 2.3 10*3/uL — ABNORMAL LOW (ref 3.8–10.8)

## 2019-06-05 LAB — LIPID PANEL
Cholesterol: 189 mg/dL (ref <200)
HDL: 52 mg/dL (ref >=50)
LDL Cholesterol (Calc): 117 mg/dL (calc) — ABNORMAL HIGH
Non-HDL Cholesterol (Calc): 137 mg/dL (calc) — ABNORMAL HIGH (ref <130)
Total CHOL/HDL Ratio: 3.6 (calc) (ref <5.0)
Triglycerides: 96 mg/dL (ref <150)

## 2019-06-05 LAB — HEMOGLOBIN A1C
Hgb A1c MFr Bld: 5.2 % of total Hgb (ref <5.7)
Mean Plasma Glucose: 103 (calc)
eAG (mmol/L): 5.7 (calc)

## 2019-06-05 LAB — COMPLETE METABOLIC PANEL WITH GFR
AG Ratio: 1.7 (calc) (ref 1.0–2.5)
ALT: 15 U/L (ref 6–29)
AST: 16 U/L (ref 10–35)
Albumin: 4.3 g/dL (ref 3.6–5.1)
Alkaline phosphatase (APISO): 71 U/L (ref 37–153)
BUN: 11 mg/dL (ref 7–25)
CO2: 28 mmol/L (ref 20–32)
Calcium: 10.2 mg/dL (ref 8.6–10.4)
Chloride: 108 mmol/L (ref 98–110)
Creat: 1.02 mg/dL (ref 0.50–1.05)
GFR, Est African American: 71 mL/min/{1.73_m2} (ref >=60)
GFR, Est Non African American: 61 mL/min/{1.73_m2} (ref >=60)
Globulin: 2.6 g/dL (calc) (ref 1.9–3.7)
Glucose, Bld: 90 mg/dL (ref 65–99)
Potassium: 4.3 mmol/L (ref 3.5–5.3)
Sodium: 142 mmol/L (ref 135–146)
Total Bilirubin: 0.6 mg/dL (ref 0.2–1.2)
Total Protein: 6.9 g/dL (ref 6.1–8.1)

## 2019-06-05 LAB — VITAMIN D 25 HYDROXY (VIT D DEFICIENCY, FRACTURES): Vit D, 25-Hydroxy: 16 ng/mL — ABNORMAL LOW (ref 30–100)

## 2019-06-05 LAB — VITAMIN B12: Vitamin B-12: 390 pg/mL (ref 200–1100)

## 2019-06-08 ENCOUNTER — Other Ambulatory Visit: Payer: Self-pay | Admitting: Family Medicine

## 2019-06-08 DIAGNOSIS — E559 Vitamin D deficiency, unspecified: Secondary | ICD-10-CM

## 2019-06-08 DIAGNOSIS — E538 Deficiency of other specified B group vitamins: Secondary | ICD-10-CM

## 2019-06-08 MED ORDER — VITAMIN D (ERGOCALCIFEROL) 1.25 MG (50000 UNIT) PO CAPS: 50000.0000 [IU] | ORAL_CAPSULE | ORAL | 3 refills | Status: DC

## 2019-06-08 MED ORDER — VITAMIN B-12 500 MCG SL SUBL: 1.0000 | SUBLINGUAL_TABLET | Freq: Every day | SUBLINGUAL | 5 refills | Status: DC

## 2020-01-02 IMAGING — MG MM DIGITAL SCREENING BILAT W/ TOMO W/ CAD
8 of 13 series · 8 of 29 positions shown · non-contrast
Comparison: Previous exam(s).

CLINICAL DATA: Screening.

EXAM:
DIGITAL SCREENING BILATERAL MAMMOGRAM WITH TOMO AND CAD

[R CV]
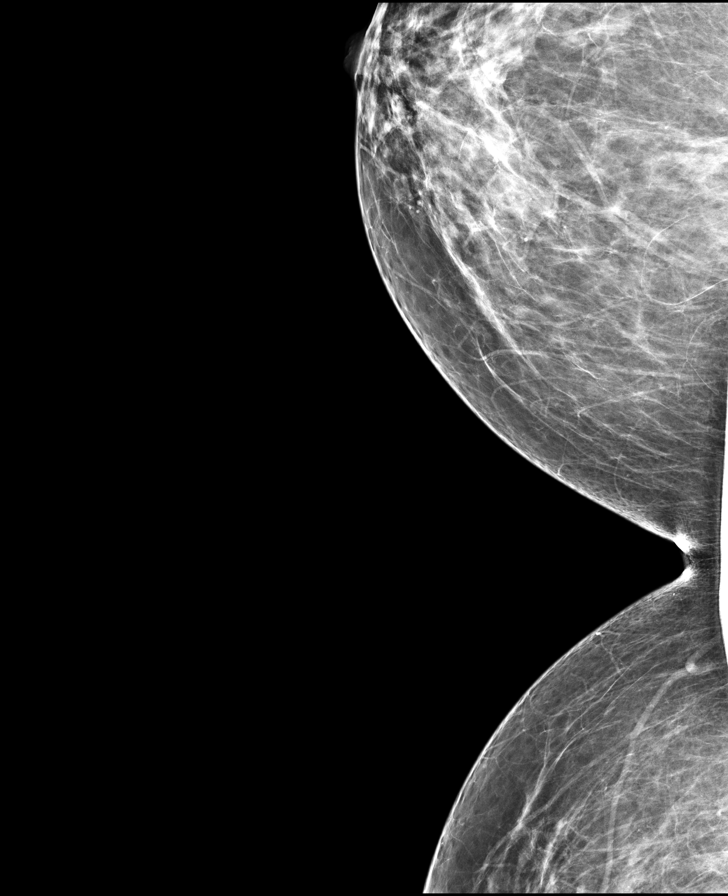

[L MLO]
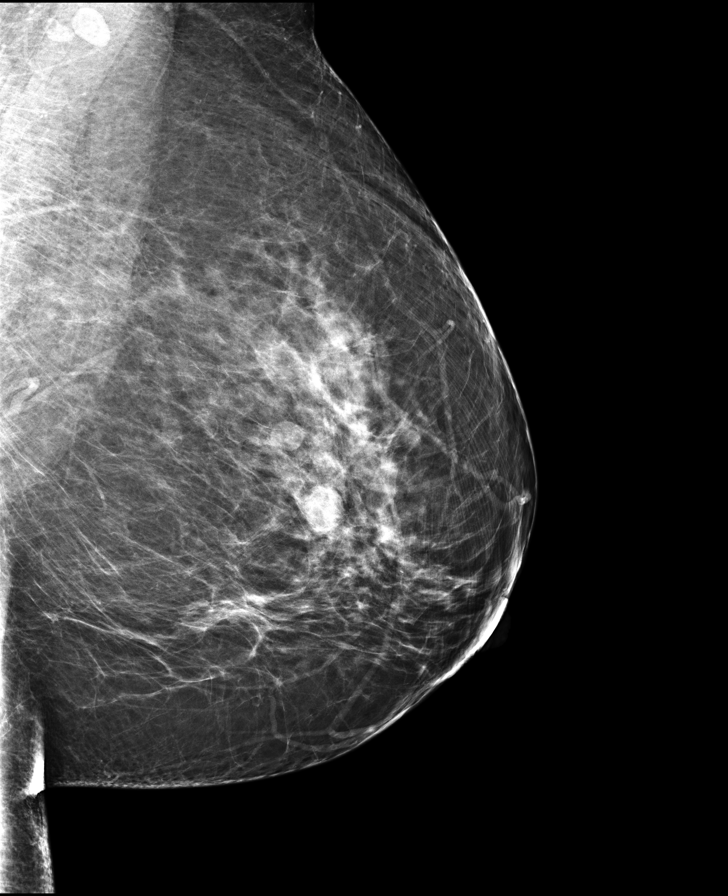

[R MLO synth-2D]
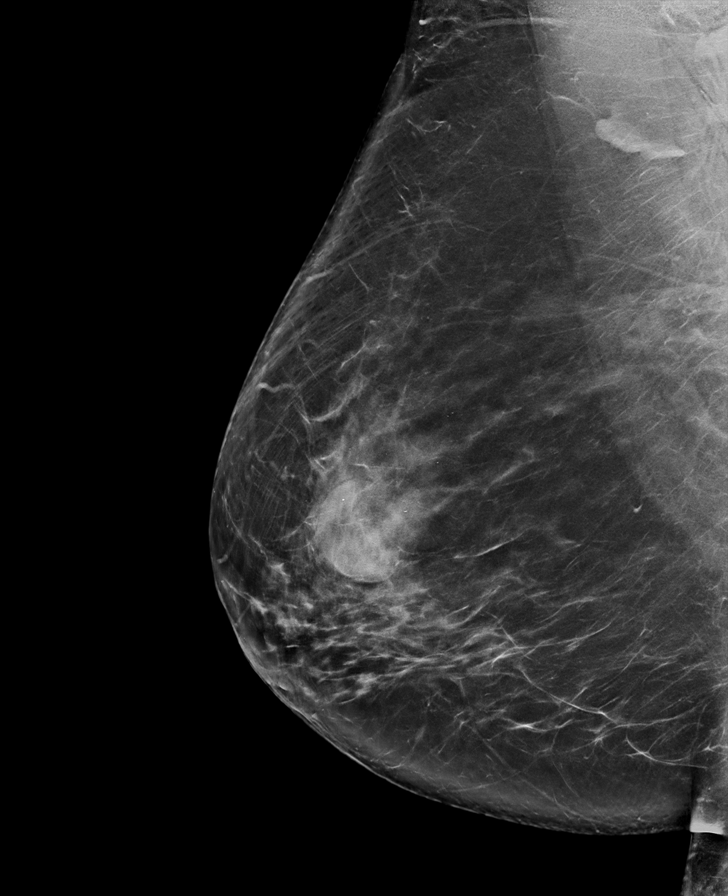

[L CC]
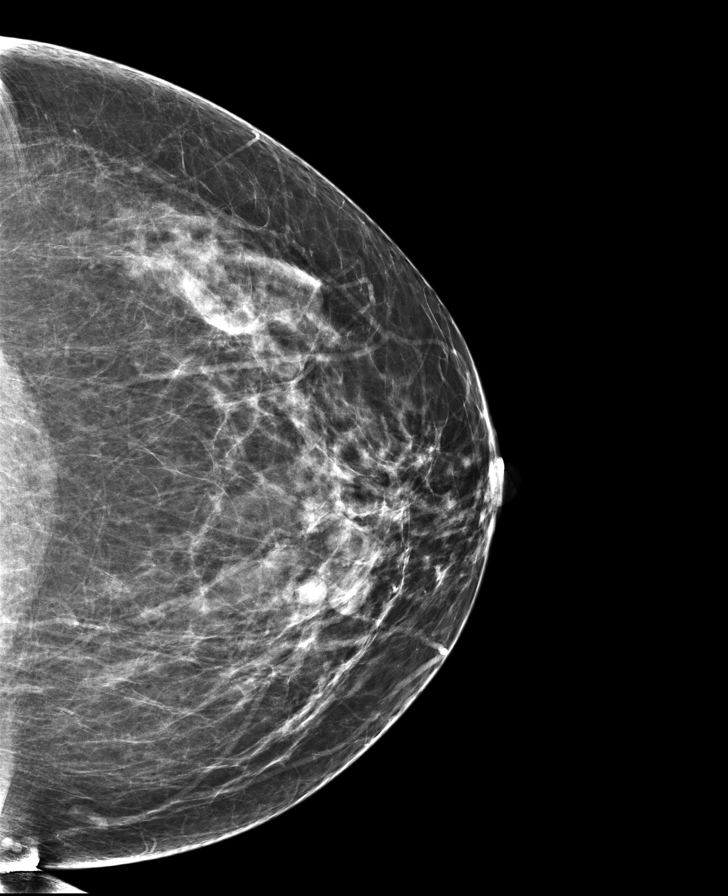

[R CC]
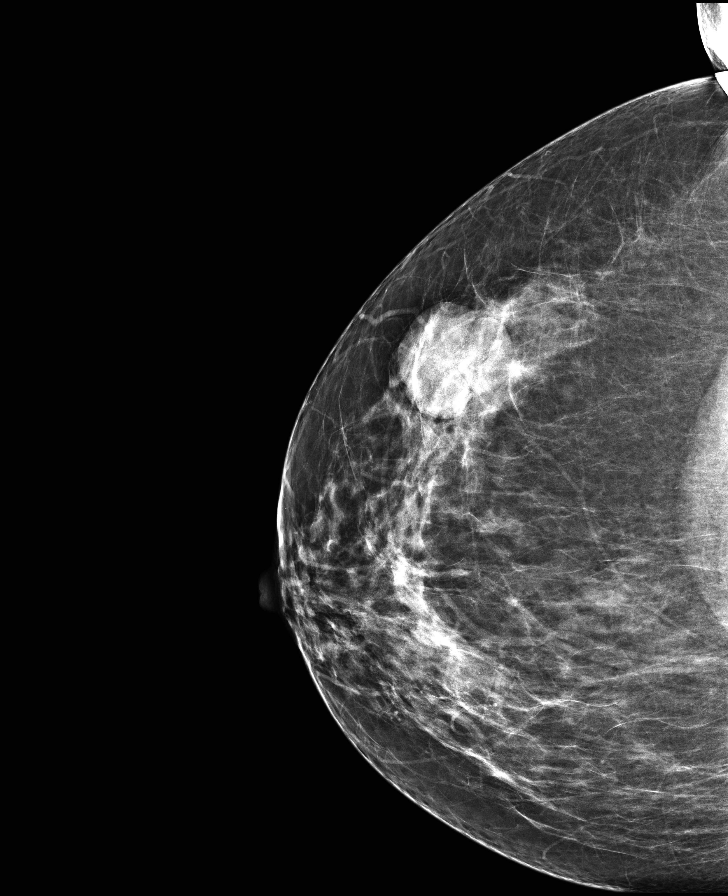

[R CC synth-2D]
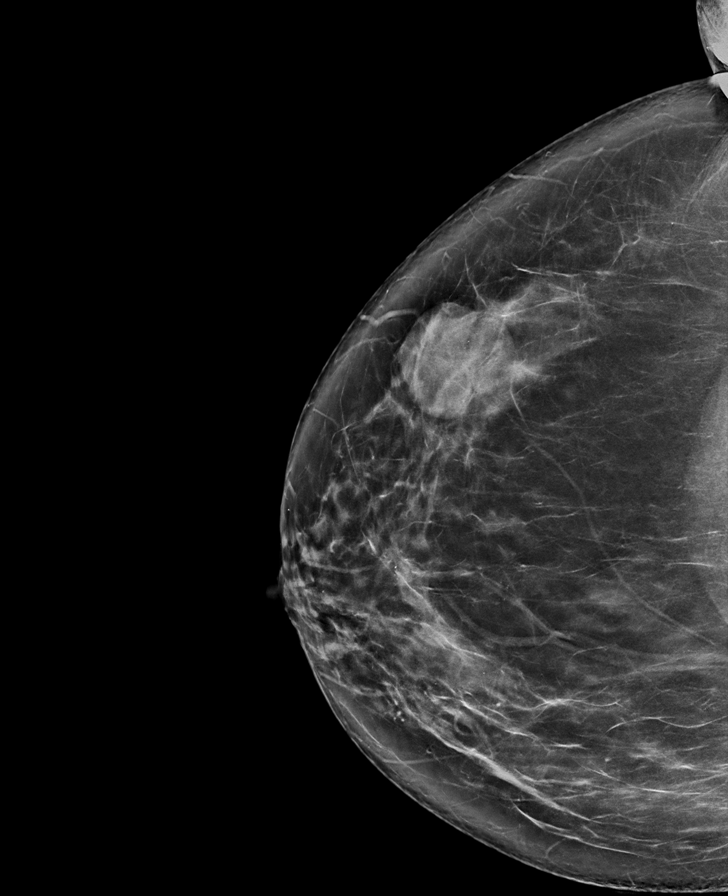

[L MLO synth-2D]
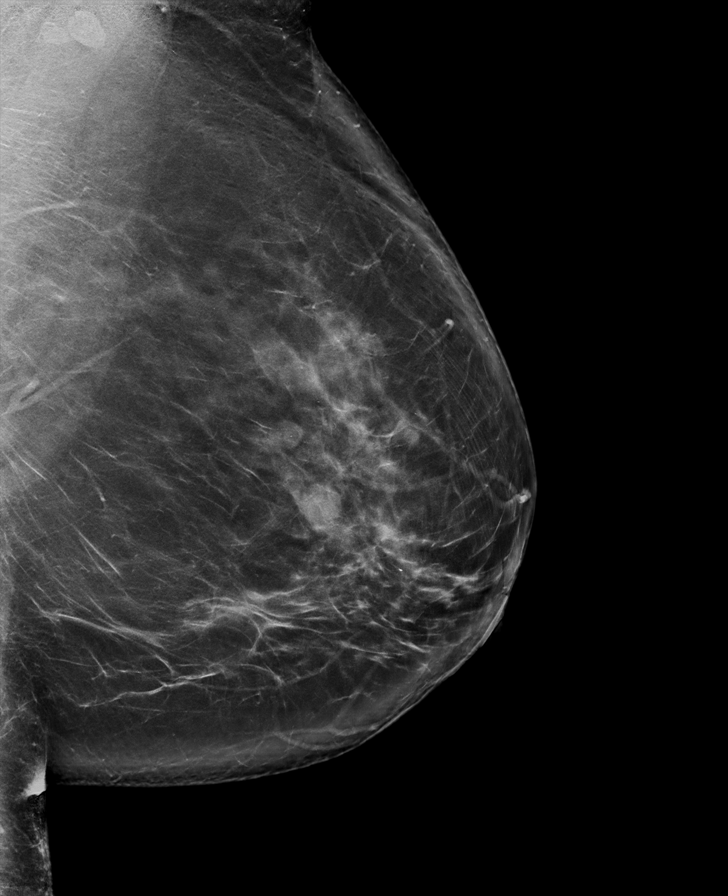

[L CC synth-2D]
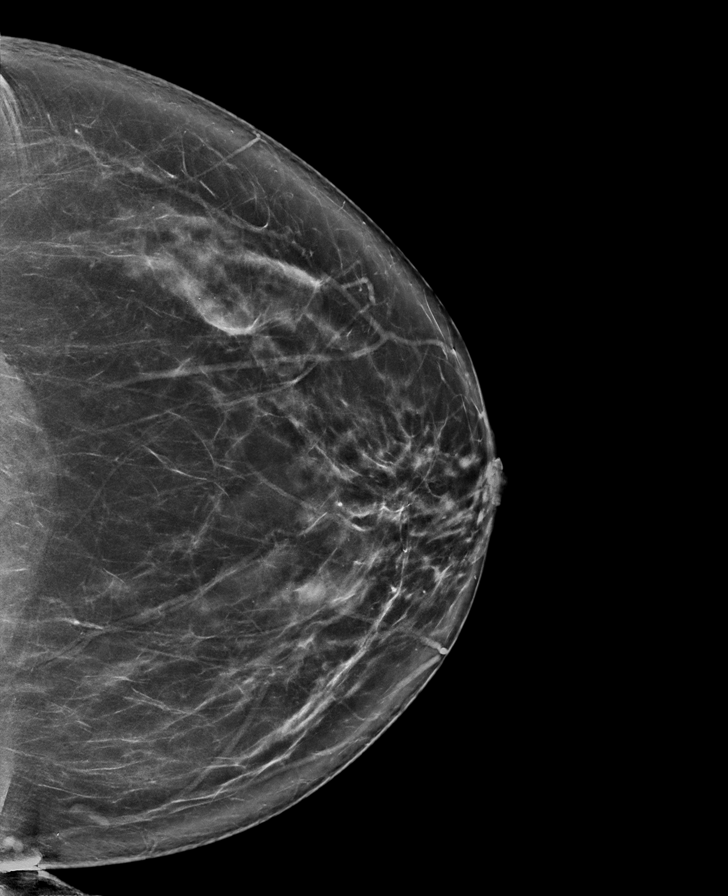

[8 of 29 positions shown; findings below may reference images not displayed]

ACR Breast Density Category c: The breast tissue is heterogeneously
dense, which may obscure small masses.
FINDINGS: There are no findings suspicious for malignancy. Images were
processed with CAD.
IMPRESSION: No mammographic evidence of malignancy. A result letter of this
screening mammogram will be mailed directly to the patient.

RECOMMENDATION:
Screening mammogram in one year. (Code:FT-U-LHB)

BI-RADS CATEGORY  1: Negative.

## 2020-03-26 ENCOUNTER — Other Ambulatory Visit: Payer: Self-pay | Admitting: Family Medicine

## 2020-03-26 DIAGNOSIS — Z1231 Encounter for screening mammogram for malignant neoplasm of breast: Secondary | ICD-10-CM

## 2020-04-08 DIAGNOSIS — E559 Vitamin D deficiency, unspecified: Secondary | ICD-10-CM | POA: Diagnosis not present

## 2020-04-08 DIAGNOSIS — Z6841 Body Mass Index (BMI) 40.0 and over, adult: Secondary | ICD-10-CM | POA: Diagnosis not present

## 2020-04-08 DIAGNOSIS — Z7689 Persons encountering health services in other specified circumstances: Secondary | ICD-10-CM | POA: Diagnosis not present

## 2020-04-16 ENCOUNTER — Ambulatory Visit
Admission: RE | Admit: 2020-04-16 | Discharge: 2020-04-16 | Disposition: A | Discharge status: Home / Self Care | Payer: BC Managed Care – PPO | Source: Ambulatory Visit | Attending: Family Medicine | Admitting: Family Medicine

## 2020-04-16 ENCOUNTER — Other Ambulatory Visit: Payer: Self-pay

## 2020-04-16 DIAGNOSIS — Z1231 Encounter for screening mammogram for malignant neoplasm of breast: Secondary | ICD-10-CM | POA: Diagnosis not present

## 2020-04-20 ENCOUNTER — Other Ambulatory Visit: Payer: Self-pay | Admitting: Family Medicine

## 2020-04-20 DIAGNOSIS — R928 Other abnormal and inconclusive findings on diagnostic imaging of breast: Secondary | ICD-10-CM

## 2020-04-28 ENCOUNTER — Other Ambulatory Visit: Payer: Self-pay | Admitting: Family Medicine

## 2020-04-28 DIAGNOSIS — N6320 Unspecified lump in the left breast, unspecified quadrant: Secondary | ICD-10-CM

## 2020-04-28 DIAGNOSIS — R928 Other abnormal and inconclusive findings on diagnostic imaging of breast: Secondary | ICD-10-CM

## 2020-05-06 DIAGNOSIS — Z01818 Encounter for other preprocedural examination: Secondary | ICD-10-CM | POA: Diagnosis not present

## 2020-05-06 DIAGNOSIS — D1803 Hemangioma of intra-abdominal structures: Secondary | ICD-10-CM | POA: Diagnosis not present

## 2020-05-06 DIAGNOSIS — Z7689 Persons encountering health services in other specified circumstances: Secondary | ICD-10-CM | POA: Diagnosis not present

## 2020-06-29 ENCOUNTER — Encounter: Payer: 59 | Admitting: Family Medicine

## 2020-06-29 ENCOUNTER — Ambulatory Visit: Payer: Self-pay | Admitting: Nurse Practitioner

## 2020-08-27 ENCOUNTER — Telehealth: Payer: Self-pay | Admitting: Hematology and Oncology

## 2020-08-27 NOTE — Telephone Encounter (Signed)
Received a new hem referral from Dr. Andrey Farmer for anemia. Sally Lewis  cld and has been scheduled to see Dr. Chryl Heck on 7/14 at 140pm. Pt aware tto arrive 20 minutes early.

## 2020-08-30 DIAGNOSIS — E559 Vitamin D deficiency, unspecified: Secondary | ICD-10-CM | POA: Diagnosis not present

## 2020-08-30 DIAGNOSIS — E349 Endocrine disorder, unspecified: Secondary | ICD-10-CM | POA: Diagnosis not present

## 2020-08-30 DIAGNOSIS — E538 Deficiency of other specified B group vitamins: Secondary | ICD-10-CM | POA: Diagnosis not present

## 2020-09-02 ENCOUNTER — Encounter: Payer: Self-pay | Admitting: Hematology and Oncology

## 2020-09-02 ENCOUNTER — Inpatient Hospital Stay: Payer: BC Managed Care – PPO

## 2020-09-02 ENCOUNTER — Other Ambulatory Visit: Payer: Self-pay

## 2020-09-02 ENCOUNTER — Inpatient Hospital Stay: Payer: BC Managed Care – PPO | Attending: Hematology and Oncology | Admitting: Hematology and Oncology

## 2020-09-02 VITALS — BP 117/86 | HR 93 | Temp 96.8°F | Resp 18 | Wt 308.5 lb

## 2020-09-02 DIAGNOSIS — D709 Neutropenia, unspecified: Secondary | ICD-10-CM

## 2020-09-02 LAB — CBC WITH DIFFERENTIAL/PLATELET
Abs Immature Granulocytes: 0.01 10*3/uL (ref 0.00–0.07)
Basophils Absolute: 0 10*3/uL (ref 0.0–0.1)
Basophils Relative: 0 %
Eosinophils Absolute: 0.1 10*3/uL (ref 0.0–0.5)
Eosinophils Relative: 2 %
HCT: 36.7 % (ref 36.0–46.0)
Hemoglobin: 12.8 g/dL (ref 12.0–15.0)
Immature Granulocytes: 0 %
Lymphocytes Relative: 48 %
Lymphs Abs: 1.6 10*3/uL (ref 0.7–4.0)
MCH: 31.1 pg (ref 26.0–34.0)
MCHC: 34.9 g/dL (ref 30.0–36.0)
MCV: 89.1 fL (ref 80.0–100.0)
Monocytes Absolute: 0.5 10*3/uL (ref 0.1–1.0)
Monocytes Relative: 15 %
Neutro Abs: 1.2 10*3/uL — ABNORMAL LOW (ref 1.7–7.7)
Neutrophils Relative %: 35 %
Platelets: 198 10*3/uL (ref 150–400)
RBC: 4.12 MIL/uL (ref 3.87–5.11)
RDW: 13.2 % (ref 11.5–15.5)
WBC: 3.4 10*3/uL — ABNORMAL LOW (ref 4.0–10.5)
nRBC: 0 % (ref 0.0–0.2)

## 2020-09-02 NOTE — Progress Notes (Signed)
Sally Lewis CONSULT NOTE  Patient Care Team: Steele Sizer, MD as PCP - General (Family Medicine)  CHIEF COMPLAINTS/PURPOSE OF CONSULTATION:  History of benign neutropenia.  ASSESSMENT & PLAN:  Chronic idiopathic neutropenia (HCC) This is a very pleasant 56 year old female patient with idiopathic neutropenia referred to hematology for further recommendations.  She has previously seen hematology many years ago, has had chronic neutropenia without any complications and even had bone marrow biopsy which was unremarkable according to the patient.  She is pursuing bariatric surgery and is now requiring clearance from hematology given her neutropenia. At this time there appeared to be no concerning review of systems.  Physical examination unremarkable except for obesity. I have reviewed her labs over the past several years, she has had chronic leukopenia with neutropenia. Rest of her labs appear well, no evidence of iron deficiency, overt S85 or folic acid deficiency.  Metabolic panel is normal, no concerns.  At this time I do believe she most likely has benign ethnic neutropenia.  We proceeded with labs today which showed a total white count of 3400 and ANC of 1200. I do not see any reason from hematology standpoint to not proceed with surgery.  I do resume she will recover well after surgery. She can return to hematology as needed. She can start taking over-the-counter B12 supplementation 1000 mcg daily.  Orders Placed This Encounter  Procedures   CBC with Differential/Platelet    Standing Status:   Standing    Number of Occurrences:   22    Standing Expiration Date:   09/02/2021     HISTORY OF PRESENTING ILLNESS:  Sally Lewis 55 y.o. female is here because of benign neutropenia. This is a very pleasant 55 year old female patient with past medical history of benign neutropenia previously evaluated by hematology referred for further evaluation of benign neutropenia which  was diagnosed many years ago.  According to the patient, she was trying to pursue bariatric surgery and went to see surgeon at the bariatric surgery clinic.  Since she has history of neutropenia, also anemia, they have referred her to hematology before proceeding with surgery. She feels well.  She tells me that she had extensive testing in the past when she used to see a hematologist in [place], even had a bone marrow biopsy which was unremarkable.  She denies any recurrent infections or hospitalizations.  Even when she contacts infections, she clears them on time. No other major health issues except for the obesity.  She is currently on vitamin D supplementation. Rest of the pertinent 10 point ROS reviewed and negative.  REVIEW OF SYSTEMS:   Constitutional: Denies fevers, chills or abnormal night sweats Eyes: Denies blurriness of vision, double vision or watery eyes Ears, nose, mouth, throat, and face: Denies mucositis or sore throat Respiratory: Denies cough, dyspnea or wheezes Cardiovascular: Denies palpitation, chest discomfort or lower extremity swelling Gastrointestinal:  Denies nausea, heartburn or change in bowel habits Skin: Denies abnormal skin rashes Lymphatics: Denies new lymphadenopathy or easy bruising Neurological:Denies numbness, tingling or new weaknesses Behavioral/Psych: Mood is stable, no new changes  All other systems were reviewed with the patient and are negative.  MEDICAL HISTORY:  Past Medical History:  Diagnosis Date   Abnormal uterine bleeding    Anemia    Hemangioma    on liver   History of uterine fibroid    Knee pain, right    Leukopenia    Lump or mass in breast    Malaise and fatigue  Vitamin B 12 deficiency    Vitamin D deficiency     SURGICAL HISTORY: Past Surgical History:  Procedure Laterality Date   ABDOMINAL HYSTERECTOMY     BILATERAL SALPINGECTOMY Bilateral 01/04/2015   Procedure: BILATERAL SALPINGECTOMY;  Surgeon: [name], MD;   Location: [facility] ORS;  Service: Gynecology;  Laterality: Bilateral;   BREAST BIOPSY Left 02/24/2019   u/s bx/ neg   CYSTOSCOPY N/A 01/04/2015   Procedure: CYSTOSCOPY;  Surgeon: [name], MD;  Location: [facility] ORS;  Service: Gynecology;  Laterality: N/A;   MOUTH SURGERY     ROBOTIC ASSISTED TOTAL HYSTERECTOMY N/A 01/04/2015   Procedure: ROBOTIC ASSISTED TOTAL HYSTERECTOMY;  Surgeon: [name], MD;  Location: [facility] ORS;  Service: Gynecology;  Laterality: N/A;   TUBAL LIGATION      SOCIAL HISTORY: Social History   Socioeconomic History   Marital status: Married    Spouse name: Danny    Number of children: 2   Years of education: 20   Highest education level: Not on file  Occupational History   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     Comment: 5 other universities online  Tobacco Use   Smoking status: Former    Packs/day: 0.50    Years: 4.00    Pack years: 2.00    Types: Cigarettes    Start date: 10/29/1983    Quit date: 10/29/1987    Years since quitting: 32.8   Smokeless tobacco: Never  Vaping Use   Vaping Use: Never used  Substance and Sexual Activity   Alcohol use: Yes    Alcohol/week: 0.0 standard drinks    Comment: seldom - wine   Drug use: No   Sexual activity: Yes    Partners: Male    Birth control/protection: Surgical  Other Topics Concern   Not on file  Social History Narrative   She is married, children are grown and married.   She finihed  her PhD May 2018  in health services   Teaching online and also at [employer] Strain: Not on file  Food Insecurity: Not on file  Transportation Needs: Not on file  Physical Activity: Not on file  Stress: Not on file  Social Connections: Not on file  Intimate Partner Violence: Not on file    FAMILY HISTORY: Family History  Problem Relation Age of Onset   Hypertension Mother    Cancer Father        Prostate   Sickle cell anemia  Sister    Sickle cell anemia Brother    Breast cancer Neg Hx     ALLERGIES:  has No Known Allergies.  MEDICATIONS:  Current Outpatient Medications  Medication Sig Dispense Refill   cholecalciferol (VITAMIN D) 1000 units tablet Take 1,000 Units by mouth daily.     Cyanocobalamin (VITAMIN B-12) 500 MCG SUBL Place 1 tablet (500 mcg total) under the tongue daily. 30 tablet 5   Multiple Vitamin (MULTIVITAMIN) tablet Take 1 tablet by mouth daily.     Vitamin D, Ergocalciferol, (DRISDOL) 1.25 MG (50000 UNIT) CAPS capsule Take 1 capsule (50,000 Units total) by mouth every 7 (seven) days. (Patient taking differently: Take 10,000 Units by mouth daily.) 12 capsule 3   No current facility-administered medications for this visit.    PHYSICAL EXAMINATION:  ECOG PERFORMANCE STATUS: 0 - Asymptomatic  Vitals:   09/02/20 1349  BP: 117/86  Pulse: 93  Resp: 18  Temp: (!) 96.8 F (36 C)  SpO2: 99%   Filed Weights   09/02/20 1349  Weight: (!) 308 lb 8 oz (139.9 kg)    Physical Exam Constitutional:      Appearance: Normal appearance.  HENT:     Head: Normocephalic and atraumatic.  Cardiovascular:     Rate and Rhythm: Normal rate and regular rhythm.  Pulmonary:     Effort: Pulmonary effort is normal.     Breath sounds: Normal breath sounds.  Abdominal:     General: Abdomen is flat.     Palpations: Abdomen is soft.  Musculoskeletal:     Cervical back: Normal range of motion and neck supple. No rigidity.  Lymphadenopathy:     Cervical: No cervical adenopathy.  Skin:    General: Skin is warm and dry.  Neurological:     General: No focal deficit present.     Mental Status: She is alert.  Psychiatric:        Mood and Affect: Mood normal.     LABORATORY DATA:  I have reviewed the data as listed Lab Results  Component Value Date   WBC 3.4 (L) 09/02/2020   HGB 12.8 09/02/2020   HCT 36.7 09/02/2020   MCV 89.1 09/02/2020   PLT 198 09/02/2020     Chemistry      Component Value  Date/Time   NA 142 06/04/2019 1111   NA 141 02/09/2016 0000   K 4.3 06/04/2019 1111   CL 108 06/04/2019 1111   CO2 28 06/04/2019 1111   BUN 11 06/04/2019 1111   BUN 9 02/09/2016 0000   CREATININE 1.02 06/04/2019 1111   GLU 90 02/09/2016 0000      Component Value Date/Time   CALCIUM 10.2 06/04/2019 1111   ALKPHOS 53 10/14/2014 0723   AST 16 06/04/2019 1111   ALT 15 06/04/2019 1111   BILITOT 0.6 06/04/2019 1111   BILITOT 0.3 10/14/2014 0723      RADIOGRAPHIC STUDIES:  I have personally reviewed the radiological images as listed and agreed with the findings in the report. No results found.  All questions were answered. The patient knows to call the clinic with any problems, questions or concerns. I spent 45 minutes in the care of this patient including H and P, review of records, counseling and coordination of care.     Benay Pike, MD 09/02/2020 2:49 PM

## 2020-09-02 NOTE — Assessment & Plan Note (Signed)
This is a very pleasant 55 year old female patient with idiopathic neutropenia referred to hematology for further recommendations.  She has previously seen hematology many years ago, has had chronic neutropenia without any complications and even had bone marrow biopsy which was unremarkable according to the patient.  She is pursuing bariatric surgery and is now requiring clearance from hematology given her neutropenia. At this time there appeared to be no concerning review of systems.  Physical examination unremarkable except for obesity. I have reviewed her labs over the past several years, she has had chronic leukopenia with neutropenia. Rest of her labs appear well, no evidence of iron deficiency, overt G88 or folic acid deficiency.  Metabolic panel is normal, no concerns.  At this time I do believe she most likely has benign ethnic neutropenia.  We proceeded with labs today which showed a total white count of 3400 and ANC of 1200. I do not see any reason from hematology standpoint to not proceed with surgery.  I do resume she will recover well after surgery. She can return to hematology as needed. She can start taking over-the-counter B12 supplementation 1000 mcg daily.

## 2020-09-07 DIAGNOSIS — F4320 Adjustment disorder, unspecified: Secondary | ICD-10-CM | POA: Diagnosis not present

## 2020-09-08 DIAGNOSIS — M6281 Muscle weakness (generalized): Secondary | ICD-10-CM | POA: Diagnosis not present

## 2020-09-08 DIAGNOSIS — Z713 Dietary counseling and surveillance: Secondary | ICD-10-CM | POA: Diagnosis not present

## 2020-09-08 DIAGNOSIS — R29898 Other symptoms and signs involving the musculoskeletal system: Secondary | ICD-10-CM | POA: Diagnosis not present

## 2020-09-08 DIAGNOSIS — Z6841 Body Mass Index (BMI) 40.0 and over, adult: Secondary | ICD-10-CM | POA: Diagnosis not present

## 2020-10-13 DIAGNOSIS — Z01818 Encounter for other preprocedural examination: Secondary | ICD-10-CM | POA: Diagnosis not present

## 2020-10-13 DIAGNOSIS — Z6841 Body Mass Index (BMI) 40.0 and over, adult: Secondary | ICD-10-CM | POA: Diagnosis not present

## 2020-11-04 DIAGNOSIS — Z01818 Encounter for other preprocedural examination: Secondary | ICD-10-CM | POA: Diagnosis not present

## 2020-11-04 DIAGNOSIS — Z79899 Other long term (current) drug therapy: Secondary | ICD-10-CM | POA: Diagnosis not present

## 2020-11-11 DIAGNOSIS — R9389 Abnormal findings on diagnostic imaging of other specified body structures: Secondary | ICD-10-CM | POA: Diagnosis not present

## 2020-11-11 DIAGNOSIS — Z0389 Encounter for observation for other suspected diseases and conditions ruled out: Secondary | ICD-10-CM | POA: Diagnosis not present

## 2020-11-29 DIAGNOSIS — E213 Hyperparathyroidism, unspecified: Secondary | ICD-10-CM | POA: Diagnosis not present

## 2020-11-29 DIAGNOSIS — Z6841 Body Mass Index (BMI) 40.0 and over, adult: Secondary | ICD-10-CM | POA: Diagnosis not present

## 2020-11-29 DIAGNOSIS — E782 Mixed hyperlipidemia: Secondary | ICD-10-CM | POA: Diagnosis not present

## 2020-12-01 DIAGNOSIS — E559 Vitamin D deficiency, unspecified: Secondary | ICD-10-CM | POA: Diagnosis not present

## 2020-12-01 DIAGNOSIS — E213 Hyperparathyroidism, unspecified: Secondary | ICD-10-CM | POA: Diagnosis not present

## 2020-12-01 LAB — COMPREHENSIVE METABOLIC PANEL
Albumin: 4.1 (ref 3.5–5.0)
Calcium: 10.1 (ref 8.7–10.7)
Globulin: 2.6
eGFR: 64

## 2020-12-01 LAB — BASIC METABOLIC PANEL
BUN: 12 (ref 4–21)
CO2: 22 (ref 13–22)
Chloride: 105 (ref 99–108)
Creatinine: 1 (ref <=1.1)
Glucose: 98
Potassium: 4.3 mEq/L (ref 3.5–5.1)
Sodium: 136 — AB (ref 137–147)

## 2020-12-01 LAB — VITAMIN D 25 HYDROXY (VIT D DEFICIENCY, FRACTURES): Vit D, 25-Hydroxy: 35.8

## 2020-12-01 LAB — HEPATIC FUNCTION PANEL
ALT: 15 U/L (ref 7–35)
AST: 15 (ref 13–35)
Alkaline Phosphatase: 84 (ref 25–125)
Bilirubin, Total: 0.4

## 2020-12-01 LAB — PROTEIN / CREATININE RATIO, URINE: Creatinine, Urine: 64.8

## 2020-12-02 ENCOUNTER — Encounter: Payer: Self-pay | Admitting: Family Medicine

## 2020-12-02 DIAGNOSIS — E213 Hyperparathyroidism, unspecified: Secondary | ICD-10-CM | POA: Diagnosis not present

## 2020-12-03 ENCOUNTER — Encounter: Payer: Self-pay | Admitting: Family Medicine

## 2020-12-09 DIAGNOSIS — Z6841 Body Mass Index (BMI) 40.0 and over, adult: Secondary | ICD-10-CM | POA: Diagnosis not present

## 2020-12-09 DIAGNOSIS — E213 Hyperparathyroidism, unspecified: Secondary | ICD-10-CM | POA: Diagnosis not present

## 2020-12-28 DIAGNOSIS — D72819 Decreased white blood cell count, unspecified: Secondary | ICD-10-CM | POA: Diagnosis not present

## 2020-12-28 DIAGNOSIS — E559 Vitamin D deficiency, unspecified: Secondary | ICD-10-CM | POA: Diagnosis not present

## 2020-12-28 DIAGNOSIS — Z6841 Body Mass Index (BMI) 40.0 and over, adult: Secondary | ICD-10-CM | POA: Diagnosis not present

## 2020-12-28 DIAGNOSIS — M170 Bilateral primary osteoarthritis of knee: Secondary | ICD-10-CM | POA: Diagnosis not present

## 2020-12-28 DIAGNOSIS — D519 Vitamin B12 deficiency anemia, unspecified: Secondary | ICD-10-CM | POA: Diagnosis not present

## 2020-12-28 DIAGNOSIS — Z01818 Encounter for other preprocedural examination: Secondary | ICD-10-CM | POA: Diagnosis not present

## 2020-12-28 DIAGNOSIS — A0472 Enterocolitis due to Clostridium difficile, not specified as recurrent: Secondary | ICD-10-CM | POA: Diagnosis not present

## 2020-12-28 DIAGNOSIS — E538 Deficiency of other specified B group vitamins: Secondary | ICD-10-CM | POA: Diagnosis not present

## 2020-12-28 DIAGNOSIS — E669 Obesity, unspecified: Secondary | ICD-10-CM | POA: Diagnosis not present

## 2020-12-28 DIAGNOSIS — Z86018 Personal history of other benign neoplasm: Secondary | ICD-10-CM | POA: Diagnosis not present

## 2020-12-28 DIAGNOSIS — D649 Anemia, unspecified: Secondary | ICD-10-CM | POA: Diagnosis not present

## 2021-01-24 ENCOUNTER — Ambulatory Visit: Payer: Self-pay | Admitting: Podiatry

## 2021-01-26 ENCOUNTER — Ambulatory Visit: Payer: Self-pay | Admitting: Podiatry

## 2021-02-04 ENCOUNTER — Encounter: Payer: Self-pay | Admitting: Podiatry

## 2021-02-04 ENCOUNTER — Other Ambulatory Visit: Payer: Self-pay

## 2021-02-04 ENCOUNTER — Ambulatory Visit: Payer: BC Managed Care – PPO | Admitting: Podiatry

## 2021-02-04 VITALS — BP 123/84 | HR 96 | Temp 97.6°F | Resp 16

## 2021-02-04 DIAGNOSIS — L600 Ingrowing nail: Secondary | ICD-10-CM | POA: Diagnosis not present

## 2021-02-04 MED ORDER — GENTAMICIN SULFATE 0.1 % EX CREA: 1.0000 "application " | TOPICAL_CREAM | Freq: Two times a day (BID) | CUTANEOUS | 1 refills | Status: DC

## 2021-02-04 NOTE — Progress Notes (Signed)
° °  Subjective: Patient presents today for evaluation of pain to the medial border of the left great toe. Patient is concerned for possible ingrown nail.  It is very sensitive to touch.  Patient presents today for further treatment and evaluation.  Past Medical History:  Diagnosis Date   Abnormal uterine bleeding    Anemia    Hemangioma    on liver   History of uterine fibroid    Knee pain, right    Leukopenia    Lump or mass in breast    Malaise and fatigue    Vitamin B 12 deficiency    Vitamin D deficiency     Objective:  General: Well developed, nourished, in no acute distress, alert and oriented x3   Dermatology: Skin is warm, dry and supple bilateral.  Medial border left great toe appears to be erythematous with evidence of an ingrowing nail. Pain on palpation noted to the border of the nail fold. The remaining nails appear unremarkable at this time. There are no open sores, lesions.  Vascular: Dorsalis Pedis artery and Posterior Tibial artery pedal pulses palpable. No lower extremity edema noted.   Neruologic: Grossly intact via light touch bilateral.  Musculoskeletal: Muscular strength within normal limits in all groups bilateral. Normal range of motion noted to all pedal and ankle joints.   Assesement: #1 Paronychia with ingrowing nail medial border left great toe #2 Pain in toe  Plan of Care:  1. Patient evaluated.  2. Discussed treatment alternatives and plan of care. Explained nail avulsion procedure and post procedure course to patient. 3. Patient opted for permanent partial nail avulsion of the ingrown portion of the nail.  4. Prior to procedure, local anesthesia infiltration utilized using 3 ml of a 50:50 mixture of 2% plain lidocaine and 0.5% plain marcaine in a normal hallux block fashion and a betadine prep performed.  5. Partial permanent nail avulsion with chemical matrixectomy performed using [id number] applications of phenol followed by alcohol flush.  6.  Light dressing applied.  Post care instructions provided 7.  Prescription for gentamicin 2% cream  8.  Return to clinic 2 weeks.  *Online professor for multiple universities  Edrick Kins, [facility]  Dr. Edrick Kins, DPM    [address], [place] [address]                Office [phone]  Fax [phone]

## 2021-02-25 ENCOUNTER — Ambulatory Visit: Payer: BC Managed Care – PPO | Admitting: Podiatry

## 2021-05-19 ENCOUNTER — Other Ambulatory Visit: Payer: Self-pay | Admitting: Family Medicine

## 2021-05-19 DIAGNOSIS — Z1231 Encounter for screening mammogram for malignant neoplasm of breast: Secondary | ICD-10-CM

## 2021-07-05 ENCOUNTER — Ambulatory Visit
Admission: RE | Admit: 2021-07-05 | Discharge: 2021-07-05 | Disposition: A | Discharge status: Home / Self Care | Payer: BC Managed Care – PPO | Source: Ambulatory Visit | Attending: Family Medicine | Admitting: Family Medicine

## 2021-07-05 DIAGNOSIS — Z1231 Encounter for screening mammogram for malignant neoplasm of breast: Secondary | ICD-10-CM | POA: Diagnosis not present

## 2021-09-08 NOTE — Patient Instructions (Addendum)
Please send me copy of shingrix vaccine Notes from Endo Last parathyroid level Bone density report   Preventive Care 58-58 Years Old, Female Preventive care refers to lifestyle choices and visits with your health care provider that can promote health and wellness. Preventive care visits are also called wellness exams. What can I expect for my preventive care visit? Counseling Your health care provider may ask you questions about your: Medical history, including: Past medical problems. Family medical history. Pregnancy history. Current health, including: Menstrual cycle. Method of birth control. Emotional well-being. Home life and relationship well-being. Sexual activity and sexual health. Lifestyle, including: Alcohol, nicotine or tobacco, and drug use. Access to firearms. Diet, exercise, and sleep habits. Work and work Statistician. Sunscreen use. Safety issues such as seatbelt and bike helmet use. Physical exam Your health care provider will check your: Height and weight. These may be used to calculate your BMI (body mass index). BMI is a measurement that tells if you are at a healthy weight. Waist circumference. This measures the distance around your waistline. This measurement also tells if you are at a healthy weight and may help predict your risk of certain diseases, such as type 2 diabetes and high blood pressure. Heart rate and blood pressure. Body temperature. Skin for abnormal spots. What immunizations do I need?  Vaccines are usually given at various ages, according to a schedule. Your health care provider will recommend vaccines for you based on your age, medical history, and lifestyle or other factors, such as travel or where you work. What tests do I need? Screening Your health care provider may recommend screening tests for certain conditions. This may include: Lipid and cholesterol levels. Diabetes screening. This is done by checking your blood sugar (glucose)  after you have not eaten for a while (fasting). Pelvic exam and Pap test. Hepatitis B test. Hepatitis C test. HIV (human immunodeficiency virus) test. STI (sexually transmitted infection) testing, if you are at risk. Lung cancer screening. Colorectal cancer screening. Mammogram. Talk with your health care provider about when you should start having regular mammograms. This may depend on whether you have a family history of breast cancer. BRCA-related cancer screening. This may be done if you have a family history of breast, ovarian, tubal, or peritoneal cancers. Bone density scan. This is done to screen for osteoporosis. Talk with your health care provider about your test results, treatment options, and if necessary, the need for more tests. Follow these instructions at home: Eating and drinking  Eat a diet that includes fresh fruits and vegetables, whole grains, lean protein, and low-fat dairy products. Take vitamin and mineral supplements as recommended by your health care provider. Do not drink alcohol if: Your health care provider tells you not to drink. You are pregnant, may be pregnant, or are planning to become pregnant. If you drink alcohol: Limit how much you have to 0-1 drink a day. Know how much alcohol is in your drink. In the [place] one drink equals one 12 oz bottle of beer (355 mL), one 5 oz glass of wine (148 mL), or one 1 oz glass of hard liquor (44 mL). Lifestyle Brush your teeth every morning and night with fluoride toothpaste. Floss one time each day. Exercise for at least 30 minutes 5 or more days each week. Do not use any products that contain nicotine or tobacco. These products include cigarettes, chewing tobacco, and vaping devices, such as e-cigarettes. If you need help quitting, ask your health care provider. Do not use  drugs. If you are sexually active, practice safe sex. Use a condom or other form of protection to prevent STIs. If you do not wish to become  pregnant, use a form of birth control. If you plan to become pregnant, see your health care provider for a prepregnancy visit. Take aspirin only as told by your health care provider. Make sure that you understand how much to take and what form to take. Work with your health care provider to find out whether it is safe and beneficial for you to take aspirin daily. Find healthy ways to manage stress, such as: Meditation, yoga, or listening to music. Journaling. Talking to a trusted person. Spending time with friends and family. Minimize exposure to UV radiation to reduce your risk of skin cancer. Safety Always wear your seat belt while driving or riding in a vehicle. Do not drive: If you have been drinking alcohol. Do not ride with someone who has been drinking. When you are tired or distracted. While texting. If you have been using any mind-altering substances or drugs. Wear a helmet and other protective equipment during sports activities. If you have firearms in your house, make sure you follow all gun safety procedures. Seek help if you have been physically or sexually abused. What's next? Visit your health care provider once a year for an annual wellness visit. Ask your health care provider how often you should have your eyes and teeth checked. Stay up to date on all vaccines. This information is not intended to replace advice given to you by your health care provider. Make sure you discuss any questions you have with your health care provider. Document Revised: 08/04/2020 Document Reviewed: 08/04/2020 Elsevier Patient Education  [place].

## 2021-09-08 NOTE — Progress Notes (Signed)
Name: Sally Lewis   MRN: [id number]    DOB: April 25, 1965   Date:09/09/2021       Progress Note  Subjective  Chief Complaint  Annual Exam  HPI  Patient presents for annual CPE.  Diet: she is eating healthy - cutting down on sweet tea, no snacking after dinner Exercise: discussed importance of regular physical activity    [facility] Office Visit from 06/04/2019 in [facility]  AUDIT-C Score 0      Depression: Phq 9 is  negative    09/09/2021   10:23 AM 06/04/2019   10:10 AM 03/07/2017   10:04 AM 02/08/2016   11:03 AM 10/13/2014    1:59 PM  Depression screen PHQ 2/9  Decreased Interest 0 0 2 0 0  Down, Depressed, Hopeless 0 0 0 0 0  PHQ - 2 Score 0 0 2 0 0  Altered sleeping  0 1    Tired, decreased energy  0 0    Change in appetite  0 0    Feeling bad or failure about yourself   0 0    Trouble concentrating  0 0    Moving slowly or fidgety/restless  0 0    Suicidal thoughts  0 0    PHQ-9 Score  0 3    Difficult doing work/chores   Not difficult at all     Hypertension: BP Readings from Last 3 Encounters:  09/09/21 118/76  02/04/21 123/84  09/02/20 117/86   Obesity: Wt Readings from Last 3 Encounters:  09/09/21 300 lb (136.1 kg)  09/02/20 (!) 308 lb 8 oz (139.9 kg)  06/04/19 296 lb 9.6 oz (134.5 kg)   BMI Readings from Last 3 Encounters:  09/09/21 44.30 kg/m  09/02/20 46.22 kg/m  06/04/19 44.44 kg/m     Vaccines:   Tdap: today  Shingrix: up to date - we will get records  Pneumonia: N/A Flu: 2020 COVID-19: bivalent is up to date    Hep C Screening: 01/01/15 STD testing and prevention (HIV/chl/gon/syphilis): 01/01/15 Intimate partner violence: negative screen  Sexual History : not sexually due to husband having a history of prostate cancer and had ED Menstrual History/LMP/Abnormal Bleeding: s/p hysterectomy  Discussed importance of follow up if any post-menopausal bleeding: not applicable  Incontinence Symptoms: negative for  symptoms   Breast cancer:  - Last Mammogram: 07/05/21 - BRCA gene screening: N/A  Osteoporosis Prevention : Discussed high calcium and vitamin D supplementation, weight bearing exercises Bone density: she had in the past but no records on Epic   Cervical cancer screening: N/A - no cervix -  Skin cancer: Discussed monitoring for atypical lesions  Colorectal cancer: 05/30/13   Lung cancer:  Low Dose CT Chest recommended if Age 24-80 years, 20 pack-year currently smoking OR have quit w/in 15years. Patient does not qualify for screen   ECG: 03/07/17  Advanced Care Planning: A voluntary discussion about advance care planning including the explanation and discussion of advance directives.  Discussed health care proxy and Living will, and the patient was able to identify a health care proxy as husband .  Patient does not have a living will and power of attorney of health care   Lipids: Lab Results  Component Value Date   CHOL 189 06/04/2019   CHOL 178 03/07/2017   CHOL 190 02/09/2016   Lab Results  Component Value Date   HDL 52 06/04/2019   HDL 64 03/07/2017   HDL 53 02/09/2016   Lab Results  Component Value Date   LDLCALC 117 (H) 06/04/2019   LDLCALC 97 03/07/2017   LDLCALC 109 02/09/2016   Lab Results  Component Value Date   TRIG 96 06/04/2019   TRIG 78 03/07/2017   TRIG 140 02/09/2016   Lab Results  Component Value Date   CHOLHDL 3.6 06/04/2019   CHOLHDL 2.8 03/07/2017   CHOLHDL 3.0 10/14/2014   No results found for: "LDLDIRECT"  Glucose: Glucose, Bld  Date Value Ref Range Status  06/04/2019 90 65 - 99 mg/dL Final    Comment:    .            Fasting reference interval .   05/09/2017 108 (H) 65 - 99 mg/dL Final    Comment:    .            Fasting reference interval . For someone without known diabetes, a glucose value between 100 and 125 mg/dL is consistent with prediabetes and should be confirmed with a follow-up test. .   03/07/2017 82 65 - 99 mg/dL  Final    Comment:    .            Fasting reference interval .     Patient Active Problem List   Diagnosis Date Noted   Chronic idiopathic neutropenia (HCC) 09/02/2020   Clostridium difficile diarrhea 05/21/2017   History of uterine fibroid 01/01/2015   Anemia 10/18/2014   Vitamin D deficiency 10/13/2014   Leukopenia 10/13/2014   Knee osteoarthritis 10/13/2014   Obesity (BMI 30-39.9) 10/13/2014   B12 deficiency 10/13/2014   Liver hemangioma 10/13/2014    Past Surgical History:  Procedure Laterality Date   ABDOMINAL HYSTERECTOMY     BILATERAL SALPINGECTOMY Bilateral 01/04/2015   Procedure: BILATERAL SALPINGECTOMY;  Surgeon: Hildred Laser, MD;  Location: [facility] ORS;  Service: Gynecology;  Laterality: Bilateral;   BREAST BIOPSY Left 02/24/2019   u/s bx/ neg   CYSTOSCOPY N/A 01/04/2015   Procedure: CYSTOSCOPY;  Surgeon: Hildred Laser, MD;  Location: [facility] ORS;  Service: Gynecology;  Laterality: N/A;   MOUTH SURGERY     ROBOTIC ASSISTED TOTAL HYSTERECTOMY N/A 01/04/2015   Procedure: ROBOTIC ASSISTED TOTAL HYSTERECTOMY;  Surgeon: Hildred Laser, MD;  Location: [facility] ORS;  Service: Gynecology;  Laterality: N/A;   TUBAL LIGATION      Family History  Problem Relation Age of Onset   Hypertension Mother    Cancer Father        Prostate   Sickle cell anemia Sister    Sickle cell anemia Brother    Breast cancer Neg Hx     Social History   Socioeconomic History   Marital status: Married    Spouse name: Danny    Number of children: 2   Years of education: 20   Highest education level: Not on file  Occupational History   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     Comment: 5 other universities online  Tobacco Use   Smoking status: Former    Packs/day: 0.50    Years: 4.00    Total pack years: 2.00    Types: Cigarettes    Start date: 10/29/1983    Quit date: 10/29/1987    Years since quitting: 33.8   Smokeless tobacco: Never  Vaping Use    Vaping Use: Never used  Substance and Sexual Activity   Alcohol use: Yes    Alcohol/week: 0.0 standard drinks of alcohol    Comment: seldom - wine  Drug use: No   Sexual activity: Yes    Partners: Male    Birth control/protection: Surgical  Other Topics Concern   Not on file  Social History Narrative   She is married, children are grown and married.   She finihed  her PhD May 2018  in health services   Teaching online and also at [employer] Strain: Low Risk  (09/09/2021)   Overall Financial Resource Strain (CARDIA)    Difficulty of Paying Living Expenses: Not hard at all  Food Insecurity: No Food Insecurity (09/09/2021)   Hunger Vital Sign    Worried About Running Out of Food in the Last Year: Never true    Ran Out of Food in the Last Year: Never true  Transportation Needs: No Transportation Needs (09/09/2021)   PRAPARE - Hydrologist (Medical): No    Lack of Transportation (Non-Medical): No  Physical Activity: Inactive (09/09/2021)   Exercise Vital Sign    Days of Exercise per Week: 0 days    Minutes of Exercise per Session: 0 min  Stress: No Stress Concern Present (09/09/2021)   [place]    Feeling of Stress : Only a little  Social Connections: Socially Integrated (09/09/2021)   Social Connection and Isolation Panel [NHANES]    Frequency of Communication with Friends and Family: Three times a week    Frequency of Social Gatherings with Friends and Family: Twice a week    Attends Religious Services: More than 4 times per year    Active Member of [employer] or Organizations: Yes    Attends Archivist Meetings: 1 to 4 times per year    Marital Status: Married  Human resources officer Violence: Not At Risk (09/09/2021)   Humiliation, Afraid, Rape, and Kick questionnaire    Fear of Current or Ex-Partner: No    Emotionally Abused: No     Physically Abused: No    Sexually Abused: No     Current Outpatient Medications:    cholecalciferol (VITAMIN D) 1000 units tablet, Take 1,000 Units by mouth daily., Disp: , Rfl:    Cyanocobalamin (VITAMIN B-12) 500 MCG SUBL, Place 1 tablet (500 mcg total) under the tongue daily. (Patient not taking: Reported on 09/09/2021), Disp: 30 tablet, Rfl: 5   gentamicin cream (GARAMYCIN) 0.1 %, Apply 1 application topically 2 (two) times daily. (Patient not taking: Reported on 09/09/2021), Disp: 30 g, Rfl: 1   Multiple Vitamin (MULTIVITAMIN) tablet, Take 1 tablet by mouth daily. (Patient not taking: Reported on 09/09/2021), Disp: , Rfl:    Vitamin D, Ergocalciferol, (DRISDOL) 1.25 MG (50000 UNIT) CAPS capsule, Take 1 capsule (50,000 Units total) by mouth every 7 (seven) days. (Patient not taking: Reported on 09/09/2021), Disp: 12 capsule, Rfl: 3  No Known Allergies   ROS  Constitutional: Negative for fever or weight change.  Respiratory: Negative for cough and shortness of breath.   Cardiovascular: Negative for chest pain or palpitations.  Gastrointestinal: Negative for abdominal pain, no bowel changes.  Musculoskeletal: Negative for gait problem or joint swelling.  Skin: Negative for rash.  Neurological: Negative for dizziness or headache.  No other specific complaints in a complete review of systems (except as listed in HPI above).   Objective  Vitals:   09/09/21 1023  BP: 118/76  Pulse: 87  Resp: 16  SpO2: 97%  Weight: 300 lb (136.1 kg)  Height: 5'  9" (1.753 m)    Body mass index is 44.3 kg/m.  Physical Exam  Constitutional: Patient appears well-developed and well-nourished. No distress.  HENT: Head: Normocephalic and atraumatic. Ears: B TMs ok, no erythema or effusion; Nose: Nose normal. Mouth/Throat: Oropharynx is clear and moist. No oropharyngeal exudate.  Eyes: Conjunctivae and EOM are normal. Pupils are equal, round, and reactive to light. No scleral icterus.  Neck: Normal  range of motion. Neck supple. No JVD present. No thyromegaly present.  Cardiovascular: Normal rate, regular rhythm and normal heart sounds.  No murmur heard. No BLE edema. Pulmonary/Chest: Effort normal and breath sounds normal. No respiratory distress. Abdominal: Soft. Bowel sounds are normal, no distension. There is no tenderness. no masses Breast: no lumps or masses, no nipple discharge or rashes FEMALE GENITALIA:  Not done  RECTAL: not done  Musculoskeletal: Normal range of motion, no joint effusions. No gross deformities Neurological: he is alert and oriented to person, place, and time. No cranial nerve deficit. Coordination, balance, strength, speech and gait are normal.  Skin: Skin is warm and dry. No rash noted. No erythema.  Psychiatric: Patient has a normal mood and affect. behavior is normal. Judgment and thought content normal.    Fall Risk:    09/09/2021   10:23 AM 06/04/2019   10:10 AM 05/09/2017    2:47 PM 03/07/2017   10:04 AM 02/08/2016   11:03 AM  Fall Risk   Falls in the past year? 0 0 No No No  Number falls in past yr: 0 0     Injury with Fall? 0 0     Risk for fall due to : No Fall Risks      Follow up Falls prevention discussed         Functional Status Survey: Is the patient deaf or have difficulty hearing?: No Does the patient have difficulty seeing, even when wearing glasses/contacts?: No Does the patient have difficulty concentrating, remembering, or making decisions?: No Does the patient have difficulty walking or climbing stairs?: No Does the patient have difficulty dressing or bathing?: No Does the patient have difficulty doing errands alone such as visiting a doctor's office or shopping?: No   Assessment & Plan  1. Well adult exam  - Lipid panel - PTH, intact and calcium - Hemoglobin A1c - CBC with Differential/Platelet - Comp. Metabolic Panel (12)  2. Breast cancer screening by mammogram  Up to date   3. Need for Tdap vaccination  -  Tdap vaccine greater than or equal to 7yo IM  4. Elevated PTHrP level  - PTH, intact and calcium  5. Need for lipid screening  - Lipid panel  6. Screening for diabetes mellitus  - Hemoglobin A1c  7. Chronic idiopathic neutropenia (HCC)  - CBC with Differential/Platelet   -USPSTF grade A and B recommendations reviewed with patient; age-appropriate recommendations, preventive care, screening tests, etc discussed and encouraged; healthy living encouraged; see AVS for patient education given to patient -Discussed importance of 150 minutes of physical activity weekly, eat two servings of fish weekly, eat one serving of tree nuts ( cashews, pistachios, pecans, almonds.Marland Kitchen) every other day, eat 6 servings of fruit/vegetables daily and drink plenty of water and avoid sweet beverages.   -Reviewed Health Maintenance: Yes.

## 2021-09-09 ENCOUNTER — Encounter: Payer: Self-pay | Admitting: Family Medicine

## 2021-09-09 ENCOUNTER — Ambulatory Visit (INDEPENDENT_AMBULATORY_CARE_PROVIDER_SITE_OTHER): Payer: BC Managed Care – PPO | Admitting: Family Medicine

## 2021-09-09 VITALS — BP 118/76 | HR 87 | Resp 16 | Ht 69.0 in | Wt 300.0 lb

## 2021-09-09 DIAGNOSIS — Z13220 Encounter for screening for lipoid disorders: Secondary | ICD-10-CM | POA: Diagnosis not present

## 2021-09-09 DIAGNOSIS — Z131 Encounter for screening for diabetes mellitus: Secondary | ICD-10-CM

## 2021-09-09 DIAGNOSIS — Z0000 Encounter for general adult medical examination without abnormal findings: Secondary | ICD-10-CM

## 2021-09-09 DIAGNOSIS — D709 Neutropenia, unspecified: Secondary | ICD-10-CM | POA: Diagnosis not present

## 2021-09-09 DIAGNOSIS — Z23 Encounter for immunization: Secondary | ICD-10-CM | POA: Diagnosis not present

## 2021-09-09 DIAGNOSIS — Z1231 Encounter for screening mammogram for malignant neoplasm of breast: Secondary | ICD-10-CM | POA: Diagnosis not present

## 2021-09-09 DIAGNOSIS — E559 Vitamin D deficiency, unspecified: Secondary | ICD-10-CM

## 2021-09-09 DIAGNOSIS — R7989 Other specified abnormal findings of blood chemistry: Secondary | ICD-10-CM | POA: Diagnosis not present

## 2021-09-09 DIAGNOSIS — E538 Deficiency of other specified B group vitamins: Secondary | ICD-10-CM | POA: Diagnosis not present

## 2021-09-10 LAB — COMP. METABOLIC PANEL (12)
AST: 25 IU/L (ref 0–40)
Albumin/Globulin Ratio: 1.6 (ref 1.2–2.2)
Albumin: 4.4 g/dL (ref 3.8–4.9)
Alkaline Phosphatase: 88 IU/L (ref 44–121)
BUN/Creatinine Ratio: 14 (ref 9–23)
BUN: 13 mg/dL (ref 6–24)
Bilirubin Total: 0.5 mg/dL (ref 0.0–1.2)
Calcium: 10.6 mg/dL — ABNORMAL HIGH (ref 8.7–10.2)
Chloride: 107 mmol/L — ABNORMAL HIGH (ref 96–106)
Creatinine, Ser: 0.93 mg/dL (ref 0.57–1.00)
Globulin, Total: 2.7 g/dL (ref 1.5–4.5)
Glucose: 94 mg/dL (ref 70–99)
Potassium: 4.3 mmol/L (ref 3.5–5.2)
Sodium: 142 mmol/L (ref 134–144)
Total Protein: 7.1 g/dL (ref 6.0–8.5)
eGFR: 71 mL/min/{1.73_m2} (ref >59)

## 2021-09-10 LAB — CBC WITH DIFFERENTIAL/PLATELET
Basophils Absolute: 0 10*3/uL (ref 0.0–0.2)
Basos: 0 %
EOS (ABSOLUTE): 0 10*3/uL (ref 0.0–0.4)
Eos: 1 %
Hematocrit: 39.7 % (ref 34.0–46.6)
Hemoglobin: 13.6 g/dL (ref 11.1–15.9)
Immature Grans (Abs): 0 10*3/uL (ref 0.0–0.1)
Immature Granulocytes: 0 %
Lymphocytes Absolute: 1.2 10*3/uL (ref 0.7–3.1)
Lymphs: 43 %
MCH: 30.6 pg (ref 26.6–33.0)
MCHC: 34.3 g/dL (ref 31.5–35.7)
MCV: 89 fL (ref 79–97)
Monocytes Absolute: 0.5 10*3/uL (ref 0.1–0.9)
Monocytes: 17 %
Neutrophils Absolute: 1.1 10*3/uL — ABNORMAL LOW (ref 1.4–7.0)
Neutrophils: 39 %
Platelets: 210 10*3/uL (ref 150–450)
RBC: 4.44 x10E6/uL (ref 3.77–5.28)
RDW: 13.2 % (ref 11.7–15.4)
WBC: 2.9 10*3/uL — ABNORMAL LOW (ref 3.4–10.8)

## 2021-09-10 LAB — VITAMIN B12: Vitamin B-12: 596 pg/mL (ref 232–1245)

## 2021-09-10 LAB — LIPID PANEL
Chol/HDL Ratio: 3.4 ratio (ref 0.0–4.4)
Cholesterol, Total: 194 mg/dL (ref 100–199)
HDL: 57 mg/dL (ref >39)
LDL Chol Calc (NIH): 119 mg/dL — ABNORMAL HIGH (ref 0–99)
Triglycerides: 100 mg/dL (ref 0–149)
VLDL Cholesterol Cal: 18 mg/dL (ref 5–40)

## 2021-09-10 LAB — HEMOGLOBIN A1C
Est. average glucose Bld gHb Est-mCnc: 105 mg/dL
Hgb A1c MFr Bld: 5.3 % (ref 4.8–5.6)

## 2021-09-10 LAB — PTH, INTACT AND CALCIUM: PTH: 60 pg/mL (ref 15–65)

## 2021-09-10 LAB — VITAMIN D 25 HYDROXY (VIT D DEFICIENCY, FRACTURES): Vit D, 25-Hydroxy: 26.2 ng/mL — ABNORMAL LOW (ref 30.0–100.0)

## 2021-09-15 ENCOUNTER — Encounter: Payer: Self-pay | Admitting: Family Medicine

## 2021-09-21 ENCOUNTER — Encounter: Payer: Self-pay | Admitting: Family Medicine

## 2021-09-21 DIAGNOSIS — E210 Primary hyperparathyroidism: Secondary | ICD-10-CM | POA: Insufficient documentation

## 2021-10-20 ENCOUNTER — Ambulatory Visit: Payer: BC Managed Care – PPO | Admitting: Family Medicine

## 2021-12-16 ENCOUNTER — Ambulatory Visit: Payer: BC Managed Care – PPO | Admitting: Family Medicine

## 2022-08-29 ENCOUNTER — Other Ambulatory Visit: Payer: Self-pay | Admitting: Family Medicine

## 2022-08-29 DIAGNOSIS — Z1231 Encounter for screening mammogram for malignant neoplasm of breast: Secondary | ICD-10-CM

## 2022-09-05 ENCOUNTER — Ambulatory Visit
Admission: RE | Admit: 2022-09-05 | Discharge: 2022-09-05 | Disposition: A | Discharge status: Home / Self Care | Payer: BC Managed Care – PPO | Source: Ambulatory Visit | Attending: Family Medicine | Admitting: Family Medicine

## 2022-09-05 DIAGNOSIS — Z1231 Encounter for screening mammogram for malignant neoplasm of breast: Secondary | ICD-10-CM | POA: Diagnosis not present

## 2022-09-12 ENCOUNTER — Encounter: Payer: Self-pay | Admitting: Podiatry

## 2022-09-12 ENCOUNTER — Ambulatory Visit (INDEPENDENT_AMBULATORY_CARE_PROVIDER_SITE_OTHER): Payer: BC Managed Care – PPO

## 2022-09-12 ENCOUNTER — Ambulatory Visit: Payer: BC Managed Care – PPO | Admitting: Podiatry

## 2022-09-12 VITALS — BP 140/93 | HR 73

## 2022-09-12 DIAGNOSIS — R52 Pain, unspecified: Secondary | ICD-10-CM | POA: Diagnosis not present

## 2022-09-12 DIAGNOSIS — M7662 Achilles tendinitis, left leg: Secondary | ICD-10-CM | POA: Diagnosis not present

## 2022-09-12 DIAGNOSIS — M7732 Calcaneal spur, left foot: Secondary | ICD-10-CM | POA: Diagnosis not present

## 2022-09-12 DIAGNOSIS — M722 Plantar fascial fibromatosis: Secondary | ICD-10-CM | POA: Diagnosis not present

## 2022-09-12 MED ORDER — METHYLPREDNISOLONE 4 MG PO TBPK: ORAL_TABLET | ORAL | 0 refills | Status: DC

## 2022-09-12 MED ORDER — MELOXICAM 15 MG PO TABS: 15.0000 mg | ORAL_TABLET | Freq: Every day | ORAL | 1 refills | Status: DC

## 2022-09-12 MED ORDER — BETAMETHASONE SOD PHOS & ACET 6 (3-3) MG/ML IJ SUSP
3.0000 mg | Freq: Once | INTRAMUSCULAR | Status: AC
Start: 2022-09-12 — End: 2022-09-12
  Administered 2022-09-12: 3 mg via INTRA_ARTICULAR

## 2022-09-12 NOTE — Progress Notes (Signed)
Chief Complaint  Patient presents with   Foot Pain    "I'm having a problem with the back of my heel." N - heel pain L - posterior left D - 2 years O - gradually worse C - sharp, tender, sore, throbs A - painful when I get up in the morning and after sitting  T - Ibuprofen, I wrap it with a brace    HPI: 57 y.o. female presenting today for new complaint of pain and tenderness associated to left posterior heel.  This is been ongoing for a few years now.  Idiopathic onset.  Denies any history of injury.  She is now having pain and tenderness on a daily basis with exercise and walking.  She takes Motrin 800 mg occasionally  Past Medical History:  Diagnosis Date   Abnormal uterine bleeding    Anemia    Hemangioma    on liver   History of uterine fibroid    Knee pain, right    Leukopenia    Lump or mass in breast    Malaise and fatigue    Vitamin B 12 deficiency    Vitamin D deficiency     Past Surgical History:  Procedure Laterality Date   ABDOMINAL HYSTERECTOMY     BILATERAL SALPINGECTOMY Bilateral 01/04/2015   Procedure: BILATERAL SALPINGECTOMY;  Surgeon: Hildred Laser, MD;  Location: [facility];  Service: Gynecology;  Laterality: Bilateral;   BREAST BIOPSY Left 02/24/2019   u/s bx/ neg   CYSTOSCOPY N/A 01/04/2015   Procedure: CYSTOSCOPY;  Surgeon: Hildred Laser, MD;  Location: [facility];  Service: Gynecology;  Laterality: N/A;   MOUTH SURGERY     ROBOTIC ASSISTED TOTAL HYSTERECTOMY N/A 01/04/2015   Procedure: ROBOTIC ASSISTED TOTAL HYSTERECTOMY;  Surgeon: Hildred Laser, MD;  Location: [facility];  Service: Gynecology;  Laterality: N/A;   TUBAL LIGATION      No Known Allergies   Physical Exam: General: The patient is alert and oriented x3 in no acute distress.  Dermatology: Skin is warm, dry and supple bilateral lower extremities. Negative for open lesions or macerations.  Vascular: Palpable pedal pulses bilaterally. No edema or erythema noted. Capillary refill within  normal limits.  Neurological: Grossly intact via light touch  Musculoskeletal Exam: Pain on palpation noted to the posterior tubercle of the left calcaneus at the insertion of the Achilles tendon consistent with retrocalcaneal bursitis. Range of motion within normal limits. Muscle strength 5/5 in all muscle groups bilateral lower extremities.  Radiographic Exam LT foot 09/12/2022:  Posterior calcaneal spur noted to the respective calcaneus on lateral view. No fracture or dislocation noted. Normal osseous mineralization noted.     Assessment: 1. Insertional Achilles tendinitis left 2.  Posterior heel spur left  Plan of Care:  -Patient was evaluated. Radiographs were reviewed today. -Injection of 0.5 mL Celestone Soluspan injected into the retrocalcaneal bursa. Care was taken to avoid direct injection into the Achilles tendon. -Ultimately the patient may require surgery due to the large posterior heel spur.  We will pursue conservative treatment for now -Prescription for Medrol Dosepak -Prescription for meloxicam 15 mg daily -Physical therapy was offered for the patient but ultimately she declined.  Recommend that she does daily stretching exercises at home to stretch the posterior calf.  This was demonstrated today -Recommend good supportive sneakers and advised against when barefoot.  Recommend sneakers that do not irritate the posterior heel -Return to clinic in 4 weeks.  If there is no improvement we may need to discuss  in further detail surgery.  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   Felecia Shelling, DPM [facility]  Dr. Felecia Shelling, DPM    [address] [place], [place] [address]                Office [phone]  Fax [phone]

## 2022-10-13 ENCOUNTER — Ambulatory Visit: Payer: BC Managed Care – PPO | Admitting: Podiatry

## 2022-10-13 NOTE — Progress Notes (Unsigned)
No chief complaint on file.   HPI: 57 y.o. female presenting today for new complaint of pain and tenderness associated to left posterior heel.  This is been ongoing for a few years now.  Idiopathic onset.  Denies any history of injury.  She is now having pain and tenderness on a daily basis with exercise and walking.  She takes Motrin 800 mg occasionally  Past Medical History:  Diagnosis Date   Abnormal uterine bleeding    Anemia    Hemangioma    on liver   History of uterine fibroid    Knee pain, right    Leukopenia    Lump or mass in breast    Malaise and fatigue    Vitamin B 12 deficiency    Vitamin D deficiency     Past Surgical History:  Procedure Laterality Date   ABDOMINAL HYSTERECTOMY     BILATERAL SALPINGECTOMY Bilateral 01/04/2015   Procedure: BILATERAL SALPINGECTOMY;  Surgeon: Hildred Laser, MD;  Location: [facility];  Service: Gynecology;  Laterality: Bilateral;   BREAST BIOPSY Left 02/24/2019   u/s bx/ neg   CYSTOSCOPY N/A 01/04/2015   Procedure: CYSTOSCOPY;  Surgeon: Hildred Laser, MD;  Location: [facility];  Service: Gynecology;  Laterality: N/A;   MOUTH SURGERY     ROBOTIC ASSISTED TOTAL HYSTERECTOMY N/A 01/04/2015   Procedure: ROBOTIC ASSISTED TOTAL HYSTERECTOMY;  Surgeon: Hildred Laser, MD;  Location: [facility];  Service: Gynecology;  Laterality: N/A;   TUBAL LIGATION      No Known Allergies   Physical Exam: General: The patient is alert and oriented x3 in no acute distress.  Dermatology: Skin is warm, dry and supple bilateral lower extremities. Negative for open lesions or macerations.  Vascular: Palpable pedal pulses bilaterally. No edema or erythema noted. Capillary refill within normal limits.  Neurological: Grossly intact via light touch  Musculoskeletal Exam: Pain on palpation noted to the posterior tubercle of the left calcaneus at the insertion of the Achilles tendon consistent with retrocalcaneal bursitis. Range of motion within normal limits.  Muscle strength 5/5 in all muscle groups bilateral lower extremities.  Radiographic Exam LT foot 09/12/2022:  Posterior calcaneal spur noted to the respective calcaneus on lateral view. No fracture or dislocation noted. Normal osseous mineralization noted.     Assessment: 1. Insertional Achilles tendinitis left 2.  Posterior heel spur left  Plan of Care:  -Patient was evaluated. Radiographs were reviewed today. -Injection of 0.5 mL Celestone Soluspan injected into the retrocalcaneal bursa. Care was taken to avoid direct injection into the Achilles tendon. -Ultimately the patient may require surgery due to the large posterior heel spur.  We will pursue conservative treatment for now -Prescription for Medrol Dosepak -Prescription for meloxicam 15 mg daily -Physical therapy was offered for the patient but ultimately she declined.  Recommend that she does daily stretching exercises at home to stretch the posterior calf.  This was demonstrated today -Recommend good supportive sneakers and advised against when barefoot.  Recommend sneakers that do not irritate the posterior heel -Return to clinic in 4 weeks.  If there is no improvement we may need to discuss in further detail surgery.  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   Felecia Shelling, DPM [facility]  Dr. Felecia Shelling, DPM    [address]  [place], [place] [address]                Office [phone]  Fax [phone]

## 2022-10-20 ENCOUNTER — Encounter: Payer: Self-pay | Admitting: Podiatry

## 2022-10-20 ENCOUNTER — Ambulatory Visit: Payer: BC Managed Care – PPO | Admitting: Podiatry

## 2022-10-20 DIAGNOSIS — M7662 Achilles tendinitis, left leg: Secondary | ICD-10-CM | POA: Diagnosis not present

## 2022-10-20 DIAGNOSIS — M7732 Calcaneal spur, left foot: Secondary | ICD-10-CM

## 2022-10-20 NOTE — Progress Notes (Signed)
Chief Complaint  Patient presents with   Foot Pain    "It's doing okay.  The pain is starting to come back but not as bad."    HPI: 57 y.o. female presenting today for follow-up evaluation of pain and tenderness associated to left posterior heel.  This is been ongoing for a few years now.  Idiopathic onset.  Denies any history of injury.  Unfortunately the patient has tried multiple conservative modalities including shoe gear modifications over the past few years and stretching exercises with no relief of her symptoms.  Last visit on 09/12/2022 cortisone injection was administered as well as oral anti-inflammatory.  She says that there was some relief of her symptoms but she continues to have some persistent heel pain.  She is very frustrated and would like to discuss surgery  Past Medical History:  Diagnosis Date   Abnormal uterine bleeding    Anemia    Hemangioma    on liver   History of uterine fibroid    Knee pain, right    Leukopenia    Lump or mass in breast    Malaise and fatigue    Vitamin B 12 deficiency    Vitamin D deficiency     Past Surgical History:  Procedure Laterality Date   ABDOMINAL HYSTERECTOMY     BILATERAL SALPINGECTOMY Bilateral 01/04/2015   Procedure: BILATERAL SALPINGECTOMY;  Surgeon: Hildred Laser, MD;  Location: [facility];  Service: Gynecology;  Laterality: Bilateral;   BREAST BIOPSY Left 02/24/2019   u/s bx/ neg   CYSTOSCOPY N/A 01/04/2015   Procedure: CYSTOSCOPY;  Surgeon: Hildred Laser, MD;  Location: [facility];  Service: Gynecology;  Laterality: N/A;   MOUTH SURGERY     ROBOTIC ASSISTED TOTAL HYSTERECTOMY N/A 01/04/2015   Procedure: ROBOTIC ASSISTED TOTAL HYSTERECTOMY;  Surgeon: Hildred Laser, MD;  Location: [facility];  Service: Gynecology;  Laterality: N/A;   TUBAL LIGATION      No Known Allergies   Physical Exam: General: The patient is alert and oriented x3 in no acute distress.  Dermatology: Skin is warm, dry and supple bilateral lower  extremities. Negative for open lesions or macerations.  Vascular: Palpable pedal pulses bilaterally. No edema or erythema noted. Capillary refill within normal limits.  Neurological: Grossly intact via light touch  Musculoskeletal Exam: There continues to be pain on palpation noted to the posterior tubercle of the left calcaneus at the insertion of the Achilles tendon consistent with retrocalcaneal bursitis. Range of motion within normal limits. Muscle strength 5/5 in all muscle groups bilateral lower extremities.  Radiographic Exam LT foot 09/12/2022:  Posterior calcaneal spur noted to the respective calcaneus on lateral view. No fracture or dislocation noted. Normal osseous mineralization noted.     Assessment: 1. Insertional Achilles tendinitis left 2.  Posterior heel spur left  Plan of Care:  -Patient was evaluated.  -Unfortunate the patient has had pain and tenderness associated to the posterior aspect of the heel now for a few years.  She has tried conservative modalities including shoe gear modifications and cortisone injections, home therapy exercises, oral anti-inflammatories with no permanent lasting alleviation of her symptoms.  Patient states that she is ready to pursue surgery to permanently correct for the symptomatic posterior heel spur and repair of her Achilles tendon. -Today we discussed the conservative vs. surgical management. The patient opts for surgical management.  Risk benefits advantages and disadvantages explained.  Possible complications discussed.  All patient questions were answered. No guarantees were expressed or implied.  Postoperative recovery course was also explained.  Patient understands that she will be nonweightbearing for approximately 6 weeks postoperatively. -Authorization for surgery was initiated today. Surgery will consist of Retrocalcaneal exostectomy heel spur left. Repair of achilles tendon left. 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   Felecia Shelling, DPM [facility]  Dr. Felecia Shelling, DPM    [address] [place], [place] [address]                Office [phone]  Fax [phone]

## 2022-10-30 NOTE — Progress Notes (Unsigned)
Name: Sally Lewis   MRN: [id number]    DOB: 1966-01-04   Date:10/30/2022       Progress Note  Subjective  Chief Complaint  Annual Exam  HPI  Patient presents for annual CPE.  Diet: *** Exercise: ***  Last Eye Exam: *** Last Dental Exam: ***  Flowsheet Row Office Visit from 06/04/2019 in [facility]  AUDIT-C Score 0      Depression: Phq 9 is  {Desc; negative/positive:13464}    09/09/2021   10:23 AM 06/04/2019   10:10 AM 03/07/2017   10:04 AM 02/08/2016   11:03 AM 10/13/2014    1:59 PM  Depression screen PHQ 2/9  Decreased Interest 0 0 2 0 0  Down, Depressed, Hopeless 0 0 0 0 0  PHQ - 2 Score 0 0 2 0 0  Altered sleeping  0 1    Tired, decreased energy  0 0    Change in appetite  0 0    Feeling bad or failure about yourself   0 0    Trouble concentrating  0 0    Moving slowly or fidgety/restless  0 0    Suicidal thoughts  0 0    PHQ-9 Score  0 3    Difficult doing work/chores   Not difficult at all     Hypertension: BP Readings from Last 3 Encounters:  09/12/22 (!) 140/93  09/09/21 118/76  02/04/21 123/84   Obesity: Wt Readings from Last 3 Encounters:  09/09/21 300 lb (136.1 kg)  09/02/20 (!) 308 lb 8 oz (139.9 kg)  06/04/19 296 lb 9.6 oz (134.5 kg)   BMI Readings from Last 3 Encounters:  09/09/21 44.30 kg/m  09/02/20 46.22 kg/m  06/04/19 44.44 kg/m     Vaccines:   HPV: N/A Tdap: up to date Shingrix:  up to date Pneumonia: N/A Flu: 2020, due COVID-19: up to date   Hep C Screening: 01/01/15 STD testing and prevention (HIV/chl/gon/syphilis): 01/01/15 Intimate partner violence: negative screen  Sexual History : Menstrual History/LMP/Abnormal Bleeding:  Discussed importance of follow up if any post-menopausal bleeding: yes  Incontinence Symptoms: negative for symptoms   Breast cancer:  - Last Mammogram: 09/05/22 - BRCA gene screening: N/A  Osteoporosis Prevention : Discussed high calcium and vitamin D  supplementation, weight bearing exercises Bone density: N/A  Cervical cancer screening: N/A  Skin cancer: Discussed monitoring for atypical lesions  Colorectal cancer: 05/30/13   Lung cancer:  Low Dose CT Chest recommended if Age 40-80 years, 20 pack-year currently smoking OR have quit w/in 15years. Patient does not qualify for screen   ECG: 03/07/17  Advanced Care Planning: A voluntary discussion about advance care planning including the explanation and discussion of advance directives.  Discussed health care proxy and Living will, and the patient was able to identify a health care proxy as ***.  Patient does not have a living will and power of attorney of health care   Lipids: Lab Results  Component Value Date   CHOL 194 09/09/2021   CHOL 189 06/04/2019   CHOL 178 03/07/2017   Lab Results  Component Value Date   HDL 57 09/09/2021   HDL 52 06/04/2019   HDL 64 03/07/2017   Lab Results  Component Value Date   LDLCALC 119 (H) 09/09/2021   LDLCALC 117 (H) 06/04/2019   LDLCALC 97 03/07/2017   Lab Results  Component Value Date   TRIG 100 09/09/2021   TRIG 96 06/04/2019   TRIG 78 03/07/2017  Lab Results  Component Value Date   CHOLHDL 3.4 09/09/2021   CHOLHDL 3.6 06/04/2019   CHOLHDL 2.8 03/07/2017   No results found for: "LDLDIRECT"  Glucose: Glucose  Date Value Ref Range Status  09/09/2021 94 70 - 99 mg/dL Final   Glucose, Bld  Date Value Ref Range Status  06/04/2019 90 65 - 99 mg/dL Final    Comment:    .            Fasting reference interval .   05/09/2017 108 (H) 65 - 99 mg/dL Final    Comment:    .            Fasting reference interval . For someone without known diabetes, a glucose value between 100 and 125 mg/dL is consistent with prediabetes and should be confirmed with a follow-up test. .   03/07/2017 82 65 - 99 mg/dL Final    Comment:    .            Fasting reference interval .     Patient Active Problem List   Diagnosis Date Noted    Primary hyperparathyroidism (HCC) 09/21/2021   Chronic idiopathic neutropenia (HCC) 09/02/2020   Clostridium difficile diarrhea 05/21/2017   History of uterine fibroid 01/01/2015   Anemia 10/18/2014   Vitamin D deficiency 10/13/2014   Leukopenia 10/13/2014   Knee osteoarthritis 10/13/2014   Obesity (BMI 30-39.9) 10/13/2014   B12 deficiency 10/13/2014   Liver hemangioma 10/13/2014    Past Surgical History:  Procedure Laterality Date   ABDOMINAL HYSTERECTOMY     BILATERAL SALPINGECTOMY Bilateral 01/04/2015   Procedure: BILATERAL SALPINGECTOMY;  Surgeon: Hildred Laser, MD;  Location: [facility];  Service: Gynecology;  Laterality: Bilateral;   BREAST BIOPSY Left 02/24/2019   u/s bx/ neg   CYSTOSCOPY N/A 01/04/2015   Procedure: CYSTOSCOPY;  Surgeon: Hildred Laser, MD;  Location: [facility];  Service: Gynecology;  Laterality: N/A;   MOUTH SURGERY     ROBOTIC ASSISTED TOTAL HYSTERECTOMY N/A 01/04/2015   Procedure: ROBOTIC ASSISTED TOTAL HYSTERECTOMY;  Surgeon: Hildred Laser, MD;  Location: [facility];  Service: Gynecology;  Laterality: N/A;   TUBAL LIGATION      Family History  Problem Relation Age of Onset   Hypertension Mother    Cancer Father        Prostate   Sickle cell anemia Sister    Sickle cell anemia Brother    Breast cancer Neg Hx     Social History   Socioeconomic History   Marital status: Married    Spouse name: Danny    Number of children: 2   Years of education: 20   Highest education level: Not on file  Occupational History   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     Comment: 5 other universities online  Tobacco Use   Smoking status: Former    Current packs/day: 0.00    Average packs/day: 0.5 packs/day for 4.0 years (2.0 ttl pk-yrs)    Types: Cigarettes    Start date: 10/29/1983    Quit date: 10/29/1987    Years since quitting: 35.0   Smokeless tobacco: Never  Vaping Use   Vaping status: Never Used  Substance and Sexual  Activity   Alcohol use: Not Currently    Comment: seldom - wine   Drug use: No   Sexual activity: Yes    Partners: Male    Birth control/protection: Surgical  Other Topics Concern   Not on  file  Social History Narrative   She is married, children are grown and married.   She finihed  her PhD May 2018  in health services   Teaching online and also at [employer]    Social Determinants of Health   Financial Resource Strain: Low Risk  (09/09/2021)   Overall Financial Resource Strain (CARDIA)    Difficulty of Paying Living Expenses: Not hard at all  Food Insecurity: No Food Insecurity (09/09/2021)   Hunger Vital Sign    Worried About Running Out of Food in the Last Year: Never true    Ran Out of Food in the Last Year: Never true  Transportation Needs: No Transportation Needs (09/09/2021)   PRAPARE - Administrator, Civil Service (Medical): No    Lack of Transportation (Non-Medical): No  Physical Activity: Inactive (09/09/2021)   Exercise Vital Sign    Days of Exercise per Week: 0 days    Minutes of Exercise per Session: 0 min  Stress: No Stress Concern Present (09/09/2021)   [employer] of Occupational Health - Occupational Stress Questionnaire    Feeling of Stress : Only a little  Social Connections: Socially Integrated (09/09/2021)   Social Connection and Isolation Panel [NHANES]    Frequency of Communication with Friends and Family: Three times a week    Frequency of Social Gatherings with Friends and Family: Twice a week    Attends Religious Services: More than 4 times per year    Active Member of [employer] or Organizations: Yes    Attends Banker Meetings: 1 to 4 times per year    Marital Status: Married  Catering manager Violence: Not At Risk (09/09/2021)   Humiliation, Afraid, Rape, and Kick questionnaire    Fear of Current or Ex-Partner: No    Emotionally Abused: No    Physically Abused: No    Sexually Abused: No     Current Outpatient Medications:     cholecalciferol (VITAMIN D) 1000 units tablet, Take 1,000 Units by mouth daily., Disp: , Rfl:    meloxicam (MOBIC) 15 MG tablet, Take 1 tablet (15 mg total) by mouth daily. (Patient not taking: Reported on 10/20/2022), Disp: 30 tablet, Rfl: 1   Multiple Vitamin (MULTIVITAMIN) tablet, Take 1 tablet by mouth daily., Disp: , Rfl:    vitamin B-12 (CYANOCOBALAMIN) 100 MCG tablet, Take 100 mcg by mouth daily., Disp: , Rfl:   No Known Allergies   ROS  ***  Objective  There were no vitals filed for this visit.  There is no height or weight on file to calculate BMI.  Physical Exam ***  No results found for this or any previous visit (from the past 2160 hour(s)).   Fall Risk:    09/09/2021   10:23 AM 06/04/2019   10:10 AM 05/09/2017    2:47 PM 03/07/2017   10:04 AM 02/08/2016   11:03 AM  Fall Risk   Falls in the past year? 0 0 No No No  Number falls in past yr: 0 0     Injury with Fall? 0 0     Risk for fall due to : No Fall Risks      Follow up Falls prevention discussed         Functional Status Survey:     Assessment & Plan  1. Well adult exam ***   -USPSTF grade A and B recommendations reviewed with patient; age-appropriate recommendations, preventive care, screening tests, etc discussed and encouraged; healthy living encouraged; see  AVS for patient education given to patient -Discussed importance of 150 minutes of physical activity weekly, eat two servings of fish weekly, eat one serving of tree nuts ( cashews, pistachios, pecans, almonds.Marland Kitchen) every other day, eat 6 servings of fruit/vegetables daily and drink plenty of water and avoid sweet beverages.   -Reviewed Health Maintenance: Yes.

## 2022-10-31 ENCOUNTER — Encounter: Payer: Self-pay | Admitting: Family Medicine

## 2022-10-31 ENCOUNTER — Ambulatory Visit
Admission: RE | Admit: 2022-10-31 | Discharge: 2022-10-31 | Disposition: A | Discharge status: Home / Self Care | Payer: BC Managed Care – PPO | Attending: Family Medicine | Admitting: Family Medicine

## 2022-10-31 ENCOUNTER — Ambulatory Visit (INDEPENDENT_AMBULATORY_CARE_PROVIDER_SITE_OTHER): Payer: BC Managed Care – PPO | Admitting: Family Medicine

## 2022-10-31 ENCOUNTER — Ambulatory Visit
Admission: RE | Admit: 2022-10-31 | Discharge: 2022-10-31 | Disposition: A | Discharge status: Home / Self Care | Payer: BC Managed Care – PPO | Source: Ambulatory Visit | Attending: Family Medicine | Admitting: Family Medicine

## 2022-10-31 VITALS — BP 120/70 | HR 91 | Resp 16 | Ht 70.0 in | Wt 300.0 lb

## 2022-10-31 DIAGNOSIS — Z23 Encounter for immunization: Secondary | ICD-10-CM

## 2022-10-31 DIAGNOSIS — R7989 Other specified abnormal findings of blood chemistry: Secondary | ICD-10-CM

## 2022-10-31 DIAGNOSIS — Z9884 Bariatric surgery status: Secondary | ICD-10-CM

## 2022-10-31 DIAGNOSIS — E538 Deficiency of other specified B group vitamins: Secondary | ICD-10-CM

## 2022-10-31 DIAGNOSIS — M899 Disorder of bone, unspecified: Secondary | ICD-10-CM | POA: Insufficient documentation

## 2022-10-31 DIAGNOSIS — Z0001 Encounter for general adult medical examination with abnormal findings: Secondary | ICD-10-CM | POA: Diagnosis not present

## 2022-10-31 DIAGNOSIS — Z0000 Encounter for general adult medical examination without abnormal findings: Secondary | ICD-10-CM

## 2022-10-31 DIAGNOSIS — Z13220 Encounter for screening for lipoid disorders: Secondary | ICD-10-CM

## 2022-10-31 DIAGNOSIS — D709 Neutropenia, unspecified: Secondary | ICD-10-CM | POA: Diagnosis not present

## 2022-10-31 DIAGNOSIS — E559 Vitamin D deficiency, unspecified: Secondary | ICD-10-CM | POA: Diagnosis not present

## 2022-10-31 DIAGNOSIS — Z131 Encounter for screening for diabetes mellitus: Secondary | ICD-10-CM

## 2022-10-31 DIAGNOSIS — Z1211 Encounter for screening for malignant neoplasm of colon: Secondary | ICD-10-CM

## 2022-10-31 DIAGNOSIS — Z0189 Encounter for other specified special examinations: Secondary | ICD-10-CM | POA: Diagnosis not present

## 2022-10-31 DIAGNOSIS — Z1231 Encounter for screening mammogram for malignant neoplasm of breast: Secondary | ICD-10-CM

## 2022-11-01 ENCOUNTER — Encounter: Payer: Self-pay | Admitting: Family Medicine

## 2022-11-01 LAB — CBC WITH DIFFERENTIAL/PLATELET
Absolute Monocytes: 385 {cells}/uL (ref 200–950)
Basophils Absolute: 0 {cells}/uL (ref 0–200)
Basophils Relative: 0 %
Eosinophils Absolute: 49 {cells}/uL (ref 15–500)
Eosinophils Relative: 1.4 %
HCT: 39.1 % (ref 35.0–45.0)
Hemoglobin: 13.1 g/dL (ref 11.7–15.5)
Lymphs Abs: 1512 {cells}/uL (ref 850–3900)
MCH: 30.6 pg (ref 27.0–33.0)
MCHC: 33.5 g/dL (ref 32.0–36.0)
MCV: 91.4 fL (ref 80.0–100.0)
MPV: 10.4 fL (ref 7.5–12.5)
Monocytes Relative: 11 %
Neutro Abs: 1554 {cells}/uL (ref 1500–7800)
Neutrophils Relative %: 44.4 %
Platelets: 210 10*3/uL (ref 140–400)
RBC: 4.28 10*6/uL (ref 3.80–5.10)
RDW: 12.8 % (ref 11.0–15.0)
Total Lymphocyte: 43.2 %
WBC: 3.5 10*3/uL — ABNORMAL LOW (ref 3.8–10.8)

## 2022-11-01 LAB — PTH, INTACT AND CALCIUM
Calcium: 10.7 mg/dL — ABNORMAL HIGH (ref 8.6–10.4)
PTH: 115 pg/mL — ABNORMAL HIGH (ref 16–77)

## 2022-11-01 LAB — HEMOGLOBIN A1C
Hgb A1c MFr Bld: 5.4 %{Hb} (ref <5.7)
Mean Plasma Glucose: 108 mg/dL
eAG (mmol/L): 6 mmol/L

## 2022-11-01 LAB — COMPLETE METABOLIC PANEL WITH GFR
AG Ratio: 1.7 (calc) (ref 1.0–2.5)
ALT: 19 U/L (ref 6–29)
AST: 18 U/L (ref 10–35)
Albumin: 4.3 g/dL (ref 3.6–5.1)
Alkaline phosphatase (APISO): 76 U/L (ref 37–153)
BUN/Creatinine Ratio: 12 (calc) (ref 6–22)
BUN: 13 mg/dL (ref 7–25)
CO2: 27 mmol/L (ref 20–32)
Calcium: 10.7 mg/dL — ABNORMAL HIGH (ref 8.6–10.4)
Chloride: 107 mmol/L (ref 98–110)
Creat: 1.09 mg/dL — ABNORMAL HIGH (ref 0.50–1.03)
Globulin: 2.5 g/dL (ref 1.9–3.7)
Glucose, Bld: 93 mg/dL (ref 65–99)
Potassium: 4.5 mmol/L (ref 3.5–5.3)
Sodium: 142 mmol/L (ref 135–146)
Total Bilirubin: 0.5 mg/dL (ref 0.2–1.2)
Total Protein: 6.8 g/dL (ref 6.1–8.1)
eGFR: 59 mL/min/{1.73_m2} — ABNORMAL LOW (ref >=60)

## 2022-11-01 LAB — LIPID PANEL
Cholesterol: 222 mg/dL — ABNORMAL HIGH (ref <200)
HDL: 59 mg/dL (ref >=50)
LDL Cholesterol (Calc): 140 mg/dL — ABNORMAL HIGH
Non-HDL Cholesterol (Calc): 163 mg/dL — ABNORMAL HIGH (ref <130)
Total CHOL/HDL Ratio: 3.8 (calc) (ref <5.0)
Triglycerides: 116 mg/dL (ref <150)

## 2022-11-01 LAB — IRON,TIBC AND FERRITIN PANEL
%SAT: 35 % (ref 16–45)
Ferritin: 89 ng/mL (ref 16–232)
Iron: 101 ug/dL (ref 45–160)
TIBC: 286 ug/dL (ref 250–450)

## 2022-11-01 LAB — B12 AND FOLATE PANEL
Folate: 6.7 ng/mL
Vitamin B-12: 556 pg/mL (ref 200–1100)

## 2022-11-01 LAB — VITAMIN D 25 HYDROXY (VIT D DEFICIENCY, FRACTURES): Vit D, 25-Hydroxy: 26 ng/mL — ABNORMAL LOW (ref 30–100)

## 2022-11-02 ENCOUNTER — Other Ambulatory Visit: Payer: Self-pay

## 2022-11-02 ENCOUNTER — Encounter: Payer: Self-pay | Admitting: Family Medicine

## 2022-11-02 DIAGNOSIS — R7989 Other specified abnormal findings of blood chemistry: Secondary | ICD-10-CM

## 2022-11-21 ENCOUNTER — Ambulatory Visit: Payer: BC Managed Care – PPO | Admitting: Podiatry

## 2022-11-21 ENCOUNTER — Encounter: Payer: Self-pay | Admitting: Podiatry

## 2022-11-21 VITALS — BP 130/88 | HR 87

## 2022-11-21 DIAGNOSIS — M7662 Achilles tendinitis, left leg: Secondary | ICD-10-CM | POA: Diagnosis not present

## 2022-11-21 MED ORDER — BETAMETHASONE SOD PHOS & ACET 6 (3-3) MG/ML IJ SUSP
3.0000 mg | Freq: Once | INTRAMUSCULAR | Status: AC
Start: 2022-11-21 — End: 2022-11-21
  Administered 2022-11-21: 3 mg via INTRA_ARTICULAR

## 2022-11-21 NOTE — Progress Notes (Signed)
Chief Complaint  Patient presents with   Foot Pain    "I have to do a change in my foot treatment.  I got a diagnosis of Parathyroidism.  I have to have surgery for that first.  I'm hoping to get a shot today."    HPI: 57 y.o. female presenting today for follow-up evaluation of pain and tenderness associated to left posterior heel.  Patient states that she had some recent blood work performed which showed significantly elevated PTH levels.  She is now being referred to endocrinology and will likely need to have her parathyroid removed.  For now she is going to hold off on surgical intervention to the posterior heel.   Brief history: This is been ongoing for a few years now.  Idiopathic onset.  Denies any history of injury.  Unfortunately the patient has tried multiple conservative modalities including shoe gear modifications over the past few years and stretching exercises with no relief of her symptoms.  Last visit on 09/12/2022 cortisone injection was administered as well as oral anti-inflammatory.  She says that there was some relief of her symptoms but she continues to have some persistent heel pain.  Authorization for surgery was initiated last visit  Past Medical History:  Diagnosis Date   Abnormal uterine bleeding    Anemia    Hemangioma    on liver   History of uterine fibroid    Knee pain, right    Leukopenia    Lump or mass in breast    Malaise and fatigue    Vitamin B 12 deficiency    Vitamin D deficiency     Past Surgical History:  Procedure Laterality Date   ABDOMINAL HYSTERECTOMY     BILATERAL SALPINGECTOMY Bilateral 01/04/2015   Procedure: BILATERAL SALPINGECTOMY;  Surgeon: Hildred Laser, MD;  Location: [facility];  Service: Gynecology;  Laterality: Bilateral;   BREAST BIOPSY Left 02/24/2019   u/s bx/ neg   CYSTOSCOPY N/A 01/04/2015   Procedure: CYSTOSCOPY;  Surgeon: Hildred Laser, MD;  Location: [facility];  Service: Gynecology;  Laterality: N/A;   MOUTH SURGERY      ROBOTIC ASSISTED TOTAL HYSTERECTOMY N/A 01/04/2015   Procedure: ROBOTIC ASSISTED TOTAL HYSTERECTOMY;  Surgeon: Hildred Laser, MD;  Location: [facility];  Service: Gynecology;  Laterality: N/A;   TUBAL LIGATION      No Known Allergies   Physical Exam: General: The patient is alert and oriented x3 in no acute distress.  Dermatology: Skin is warm, dry and supple bilateral lower extremities. Negative for open lesions or macerations.  Vascular: Palpable pedal pulses bilaterally. No edema or erythema noted. Capillary refill within normal limits.  Neurological: Grossly intact via light touch  Musculoskeletal Exam: There continues to be pain on palpation noted to the posterior tubercle of the left calcaneus at the insertion of the Achilles tendon consistent with retrocalcaneal bursitis. Range of motion within normal limits. Muscle strength 5/5 in all muscle groups bilateral lower extremities.  Radiographic Exam LT foot 09/12/2022:  Posterior calcaneal spur noted to the respective calcaneus on lateral view. No fracture or dislocation noted. Normal osseous mineralization noted.     Assessment: 1. Insertional Achilles tendinitis left 2.  Posterior heel spur left  Plan of Care:  -Patient was evaluated.  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   Felecia Shelling, DPM [facility]  Dr. Felecia Shelling, DPM    [address] [place], [place] [address]                Office [phone]  Fax [phone]

## 2022-12-12 DIAGNOSIS — Z6841 Body Mass Index (BMI) 40.0 and over, adult: Secondary | ICD-10-CM | POA: Diagnosis not present

## 2022-12-12 DIAGNOSIS — E210 Primary hyperparathyroidism: Secondary | ICD-10-CM | POA: Diagnosis not present

## 2022-12-12 DIAGNOSIS — E559 Vitamin D deficiency, unspecified: Secondary | ICD-10-CM | POA: Diagnosis not present

## 2023-02-05 ENCOUNTER — Encounter: Payer: Self-pay | Admitting: Podiatry

## 2023-02-06 NOTE — Progress Notes (Signed)
Name: Sally Lewis   MRN: [id number]    DOB: [birth year]   Date:02/12/2023       Progress Note  Subjective  Chief Complaint  Chief Complaint  Patient presents with   Medical Management of Chronic Issues    HPI  Discussed the use of AI scribe software for clinical note transcription with the patient, who gave verbal consent to proceed.  History of Present Illness   The patient, with a history of elevated cholesterol and calcium levels, was found to have primary hyperparathyroidism during a routine physical examination in September. She was referred to an endocrinologist for further evaluation. The patient initially deferred any intervention due to her spouse's recurrent prostate cancer and the need to provide care during his radiation treatment.  The patient also expressed concerns about weight loss. She has been mindful of her diet, focusing on portion control, reducing sugar intake, and avoiding late meals. Despite these efforts, she has not seen significant progress in weight loss. She has a history of considering bariatric surgery but decided against it at the last minute. 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 Her current weight is 298 lbs, with a BMI of 42.77, classifying her as morbidly obese.  The patient reported fatigue as a symptom of her hyperparathyroidism, along with muscle cramps. She denied excessive thirst or heartburn. She has a history of elevated cholesterol, but her risk of heart attack and stroke is currently low. She does not have diabetes.         The 10-year ASCVD risk score (Arnett DK, et al., 2019) is: 3.5%   Values used to calculate the score:     Age: 57 years     Sex: Female     Is Non-Hispanic African American: Yes     Diabetic: No     Tobacco smoker: No     Systolic Blood Pressure: 114 mmHg     Is BP treated: No     HDL Cholesterol: 59 mg/dL     Total Cholesterol: 222 mg/dL       Patient Active Problem List   Diagnosis Date Noted   Primary hyperparathyroidism (HCC) 09/21/2021   Chronic idiopathic neutropenia (HCC) 09/02/2020   Clostridium difficile diarrhea 05/21/2017   History of uterine fibroid 01/01/2015   Anemia 10/18/2014   Vitamin D deficiency 10/13/2014   Leukopenia 10/13/2014   Knee osteoarthritis 10/13/2014   Obesity (BMI 30-39.9) 10/13/2014   B12 deficiency 10/13/2014   Liver hemangioma 10/13/2014    Past Surgical History:  Procedure Laterality Date   ABDOMINAL HYSTERECTOMY     BILATERAL SALPINGECTOMY Bilateral 01/04/2015   Procedure: BILATERAL SALPINGECTOMY;  Surgeon: Hildred Laser, MD;  Location: [facility];  Service: Gynecology;  Laterality: Bilateral;   BREAST BIOPSY Left 02/24/2019   u/s bx/ neg   CYSTOSCOPY N/A 01/04/2015   Procedure: CYSTOSCOPY;  Surgeon: Hildred Laser, MD;  Location: [facility];  Service: Gynecology;  Laterality: N/A;   MOUTH SURGERY     ROBOTIC ASSISTED TOTAL HYSTERECTOMY N/A 01/04/2015   Procedure: ROBOTIC ASSISTED TOTAL HYSTERECTOMY;  Surgeon: Hildred Laser, MD;  Location: [facility];  Service: Gynecology;  Laterality: N/A;   TUBAL LIGATION      Family History  Problem Relation Age of Onset   Hypertension Mother    Cancer Father        Prostate   Sickle cell anemia Sister    Sickle cell anemia Brother  Breast cancer Neg Hx     Social History   Tobacco Use   Smoking status: Former    Current packs/day: 0.00    Average packs/day: 0.5 packs/day for 4.0 years (2.0 ttl pk-yrs)    Types: Cigarettes    Start date: 10/29/1983    Quit date: 10/29/1987    Years since quitting: 35.3   Smokeless tobacco: Never  Substance Use Topics   Alcohol use: Not Currently    Comment: seldom - wine     Current Outpatient Medications:    Calcium Carbonate-Vit D-Min (CALCIUM 1200 PO), Take by mouth., Disp: , Rfl:    cholecalciferol (VITAMIN D) 1000 units tablet, Take 1,000 Units by mouth daily., Disp: , Rfl:    Multiple  Vitamin (MULTIVITAMIN) tablet, Take 1 tablet by mouth daily., Disp: , Rfl:    vitamin B-12 (CYANOCOBALAMIN) 100 MCG tablet, Take 100 mcg by mouth daily., Disp: , Rfl:   No Known Allergies  I personally reviewed active problem list, medication list, allergies with the patient/caregiver today.   ROS  Ten systems reviewed and is negative except as mentioned in HPI    Objective  Vitals:   02/12/23 1138 02/12/23 1140  BP: 114/76   Pulse: (!) 107 81  Resp: 16   Temp: 98.3 F (36.8 C)   TempSrc: Oral   SpO2: 98%   Weight: 298 lb 1.6 oz (135.2 kg)   Height: 5\' 10"  (1.778 m)     Body mass index is 42.77 kg/m.  Physical Exam  Constitutional: Patient appears well-developed and well-nourished. Obese  No distress.  HEENT: head atraumatic, normocephalic, pupils equal and reactive to light, neck supple Cardiovascular: Normal rate, regular rhythm and normal heart sounds.  No murmur heard. No BLE edema. Pulmonary/Chest: Effort normal and breath sounds normal. No respiratory distress. Abdominal: Soft.  There is no tenderness. Psychiatric: Patient has a normal mood and affect. behavior is normal. Judgment and thought content normal.  Diabetic Foot Exam:     PHQ2/9:    02/12/2023   11:36 AM 10/31/2022    2:38 PM 09/09/2021   10:23 AM 06/04/2019   10:10 AM 03/07/2017   10:04 AM  Depression screen PHQ 2/9  Decreased Interest 0 0 0 0 2  Down, Depressed, Hopeless 0 0 0 0 0  PHQ - 2 Score 0 0 0 0 2  Altered sleeping 0 0  0 1  Tired, decreased energy 0 0  0 0  Change in appetite 0 0  0 0  Feeling bad or failure about yourself  0 0  0 0  Trouble concentrating 0 0  0 0  Moving slowly or fidgety/restless 0 0  0 0  Suicidal thoughts 0 0  0 0  PHQ-9 Score 0 0  0 3  Difficult doing work/chores Not difficult at all    Not difficult at all    phq 9 is negative   Fall Risk:    02/12/2023   11:33 AM 10/31/2022    2:38 PM 09/09/2021   10:23 AM 06/04/2019   10:10 AM 05/09/2017    2:47 PM   Fall Risk   Falls in the past year? 0 0 0 0 No  Number falls in past yr: 0 0 0 0   Injury with Fall? 0 0 0 0   Risk for fall due to : No Fall Risks No Fall Risks No Fall Risks    Follow up Falls prevention discussed;Education provided;Falls evaluation completed Falls prevention discussed Falls  prevention discussed        Assessment & Plan  Assessment and Plan    Primary Hyperparathyroidism Elevated parathyroid hormone and calcium levels. Patient initially deferred surgical intervention due to husband's health concerns but is now considering surgery. -Advise patient to contact endocrinologist at [facility] when ready to have surgery .  Obesity BMI of 42.77, indicating morbid obesity. Patient has attempted lifestyle modifications with limited success. Discussed potential for pharmacological intervention with Qsymia, pending insurance coverage and post-surgical recovery. -Start Qsymia 3.75mg /23mg  after parathyroid surgery. -Check weight in 3 months to assess for 5% weight loss. -Continue lifestyle modifications including portion control, reducing intake of sweet beverages, and increasing physical activity.  Hyperlipidemia Elevated cholesterol levels. -Continue monitoring.  General Health Maintenance -Continue monitoring blood pressure.

## 2023-02-12 ENCOUNTER — Encounter: Payer: Self-pay | Admitting: Family Medicine

## 2023-02-12 ENCOUNTER — Ambulatory Visit: Payer: BC Managed Care – PPO | Admitting: Family Medicine

## 2023-02-12 VITALS — BP 114/76 | HR 81 | Temp 98.3°F | Resp 16 | Ht 70.0 in | Wt 298.1 lb

## 2023-02-12 DIAGNOSIS — E210 Primary hyperparathyroidism: Secondary | ICD-10-CM

## 2023-02-12 DIAGNOSIS — E559 Vitamin D deficiency, unspecified: Secondary | ICD-10-CM | POA: Diagnosis not present

## 2023-02-12 DIAGNOSIS — R944 Abnormal results of kidney function studies: Secondary | ICD-10-CM

## 2023-02-12 DIAGNOSIS — E538 Deficiency of other specified B group vitamins: Secondary | ICD-10-CM

## 2023-02-12 DIAGNOSIS — E7800 Pure hypercholesterolemia, unspecified: Secondary | ICD-10-CM

## 2023-02-12 MED ORDER — QSYMIA 7.5-46 MG PO CP24: 1.0000 | ORAL_CAPSULE | ORAL | 0 refills | Status: DC

## 2023-02-12 MED ORDER — QSYMIA 3.75-23 MG PO CP24: 1.0000 | ORAL_CAPSULE | Freq: Every morning | ORAL | 0 refills | Status: DC

## 2023-03-06 DIAGNOSIS — Z87891 Personal history of nicotine dependence: Secondary | ICD-10-CM | POA: Diagnosis not present

## 2023-03-06 DIAGNOSIS — E210 Primary hyperparathyroidism: Secondary | ICD-10-CM | POA: Diagnosis not present

## 2023-03-06 DIAGNOSIS — M179 Osteoarthritis of knee, unspecified: Secondary | ICD-10-CM | POA: Diagnosis not present

## 2023-04-26 ENCOUNTER — Encounter: Payer: Self-pay | Admitting: *Deleted

## 2023-04-30 ENCOUNTER — Encounter: Payer: Self-pay | Admitting: Family Medicine

## 2023-04-30 ENCOUNTER — Ambulatory Visit: Payer: BC Managed Care – PPO | Admitting: Family Medicine

## 2023-04-30 ENCOUNTER — Telehealth: Payer: Self-pay

## 2023-04-30 VITALS — BP 124/76 | HR 77 | Resp 16 | Ht 70.0 in | Wt 300.4 lb

## 2023-04-30 DIAGNOSIS — D709 Neutropenia, unspecified: Secondary | ICD-10-CM

## 2023-04-30 DIAGNOSIS — E559 Vitamin D deficiency, unspecified: Secondary | ICD-10-CM

## 2023-04-30 DIAGNOSIS — E210 Primary hyperparathyroidism: Secondary | ICD-10-CM | POA: Diagnosis not present

## 2023-04-30 DIAGNOSIS — E7800 Pure hypercholesterolemia, unspecified: Secondary | ICD-10-CM

## 2023-04-30 DIAGNOSIS — E538 Deficiency of other specified B group vitamins: Secondary | ICD-10-CM | POA: Diagnosis not present

## 2023-04-30 DIAGNOSIS — Z862 Personal history of diseases of the blood and blood-forming organs and certain disorders involving the immune mechanism: Secondary | ICD-10-CM

## 2023-04-30 NOTE — Telephone Encounter (Signed)
 Patient left a message on main line at 4:00pm to schedule her colonoscopy.

## 2023-04-30 NOTE — Progress Notes (Signed)
 Name: Sally Lewis   MRN: [id number]    DOB: September 24, 1965   Date:04/30/2023       Progress Note  Subjective  Chief Complaint  Chief Complaint  Patient presents with   Medical Management of Chronic Issues   HPI   Morbid Obesity: she was given Qsymia on her last visit but she decided not to start it until after parathyroidectomy . BMI over 40 , advised to her to schedule a visit to check on her weight 6-8 weeks after starting medication  Primary Hyperparathyroidism: seeing surgeon at [facility] and is scheduled to have parathyroidectomy done end of March. She is having GERD symptoms , calcium at 11 and also muscle cramps   Vitamin D deficiency: taking supplementation   Chronic Idiopathic Neutropenia : [place] [id number] and multiple tests done, levels stable  B12 deficiency: continue otc supplementation   History of iron deficiency anemia: resolved since had hysterectomy, she tries to have balanced diet , she does eat meat  Hypercholesterolemia: discussed risk below with patient  The 10-year ASCVD risk score (Arnett DK, et al., 2019) is: 5%   Values used to calculate the score:     Age: 58 years     Sex: Female     Is Non-Hispanic African American: Yes     Diabetic: No     Tobacco smoker: No     Systolic Blood Pressure: 124 mmHg     Is BP treated: No     HDL Cholesterol: 59 mg/dL     Total Cholesterol: 222 mg/dL    Patient Active Problem List   Diagnosis Date Noted   History of iron deficiency anemia 04/30/2023   Pure hypercholesterolemia 04/30/2023   Morbid obesity (HCC) 04/30/2023   Primary hyperparathyroidism (HCC) 09/21/2021   Chronic idiopathic neutropenia (HCC) 09/02/2020   History of uterine fibroid 01/01/2015   Anemia 10/18/2014   Vitamin D deficiency 10/13/2014   Knee osteoarthritis 10/13/2014   Obesity (BMI 30-39.9) 10/13/2014   B12 deficiency 10/13/2014   Liver hemangioma 10/13/2014    Past Surgical History:  Procedure Laterality Date   ABDOMINAL  HYSTERECTOMY     BILATERAL SALPINGECTOMY Bilateral 01/04/2015   Procedure: BILATERAL SALPINGECTOMY;  Surgeon: Hildred Laser, MD;  Location: [facility];  Service: Gynecology;  Laterality: Bilateral;   BREAST BIOPSY Left 02/24/2019   u/s bx/ neg   CYSTOSCOPY N/A 01/04/2015   Procedure: CYSTOSCOPY;  Surgeon: Hildred Laser, MD;  Location: [facility];  Service: Gynecology;  Laterality: N/A;   MOUTH SURGERY     ROBOTIC ASSISTED TOTAL HYSTERECTOMY N/A 01/04/2015   Procedure: ROBOTIC ASSISTED TOTAL HYSTERECTOMY;  Surgeon: Hildred Laser, MD;  Location: [facility];  Service: Gynecology;  Laterality: N/A;   TUBAL LIGATION      Family History  Problem Relation Age of Onset   Hypertension Mother    Cancer Father        Prostate   Sickle cell anemia Sister    Sickle cell anemia Brother    Breast cancer Neg Hx     Social History   Tobacco Use   Smoking status: Former    Current packs/day: 0.00    Average packs/day: 0.5 packs/day for 4.0 years (2.0 ttl pk-yrs)    Types: Cigarettes    Start date: 10/29/1983    Quit date: 10/29/1987    Years since quitting: 35.5   Smokeless tobacco: Never  Substance Use Topics   Alcohol use: Not Currently    Comment: seldom - wine  Current Outpatient Medications:    Calcium Carbonate-Vit D-Min (CALCIUM 1200 PO), Take by mouth., Disp: , Rfl:    cholecalciferol (VITAMIN D) 1000 units tablet, Take 1,000 Units by mouth daily., Disp: , Rfl:    Multiple Vitamin (MULTIVITAMIN) tablet, Take 1 tablet by mouth daily., Disp: , Rfl:    vitamin B-12 (CYANOCOBALAMIN) 100 MCG tablet, Take 100 mcg by mouth daily., Disp: , Rfl:    Phentermine-Topiramate (QSYMIA) 3.75-23 MG CP24, Take 1 capsule by mouth every morning. (Patient not taking: Reported on 04/30/2023), Disp: 30 capsule, Rfl: 0   Phentermine-Topiramate (QSYMIA) 7.5-46 MG CP24, Take 1 capsule by mouth once a week. After you finish the 3.75-23 dose (Patient not taking: Reported on 04/30/2023), Disp: 30 capsule, Rfl: 0  No  Known Allergies  I personally reviewed active problem list, medication list, allergies with the patient/caregiver today.   ROS  Ten systems reviewed and is negative except as mentioned in HPI    Objective  Vitals:   04/30/23 1512  BP: 124/76  Pulse: 77  Resp: 16  SpO2: 97%  Weight: (!) 300 lb 6.4 oz (136.3 kg)  Height: 5\' 10"  (1.778 m)    Body mass index is 43.1 kg/m.  Physical Exam  Constitutional: Patient appears well-developed and well-nourished. Obese  No distress.  HEENT: head atraumatic, normocephalic, pupils equal and reactive to light, neck supple Cardiovascular: Normal rate, regular rhythm and normal heart sounds.  No murmur heard. No BLE edema. Pulmonary/Chest: Effort normal and breath sounds normal. No respiratory distress. Abdominal: Soft.  There is no tenderness. Psychiatric: Patient has a normal mood and affect. behavior is normal. Judgment and thought content normal.  Diabetic Foot Exam:     PHQ2/9:    04/30/2023    3:11 PM 02/12/2023   11:36 AM 10/31/2022    2:38 PM 09/09/2021   10:23 AM 06/04/2019   10:10 AM  Depression screen PHQ 2/9  Decreased Interest 0 0 0 0 0  Down, Depressed, Hopeless 0 0 0 0 0  PHQ - 2 Score 0 0 0 0 0  Altered sleeping 0 0 0  0  Tired, decreased energy 0 0 0  0  Change in appetite 0 0 0  0  Feeling bad or failure about yourself  0 0 0  0  Trouble concentrating 0 0 0  0  Moving slowly or fidgety/restless 0 0 0  0  Suicidal thoughts 0 0 0  0  PHQ-9 Score 0 0 0  0  Difficult doing work/chores Not difficult at all Not difficult at all       phq 9 is negative  Fall Risk:    02/12/2023   11:33 AM 10/31/2022    2:38 PM 09/09/2021   10:23 AM 06/04/2019   10:10 AM 05/09/2017    2:47 PM  Fall Risk   Falls in the past year? 0 0 0 0 No  Number falls in past yr: 0 0 0 0   Injury with Fall? 0 0 0 0   Risk for fall due to : No Fall Risks No Fall Risks No Fall Risks    Follow up Falls prevention discussed;Education  provided;Falls evaluation completed Falls prevention discussed Falls prevention discussed       Assessment & Plan   1. Primary hyperparathyroidism (HCC) (Primary)  Having surgery at the end of the month  2. Morbid obesity (HCC)  Discussed with the patient the risk posed by an increased BMI. Discussed importance of portion control, calorie  counting and at least 150 minutes of physical activity weekly. Avoid sweet beverages and drink more water. Eat at least 6 servings of fruit and vegetables daily    3. Chronic idiopathic neutropenia (HCC)  stable  4. B12 deficiency  Continue supplementation   5. Pure hypercholesterolemia  Discussed healthier diet  6. Vitamin D deficiency  Continue supplementation   7. History of iron deficiency anemia  Discussed high iron diet

## 2023-04-30 NOTE — Telephone Encounter (Signed)
 Message left for patient to return my call.

## 2023-05-01 ENCOUNTER — Telehealth: Payer: Self-pay | Admitting: *Deleted

## 2023-05-01 ENCOUNTER — Other Ambulatory Visit: Payer: Self-pay | Admitting: *Deleted

## 2023-05-01 DIAGNOSIS — Z860100 Personal history of colon polyps, unspecified: Secondary | ICD-10-CM

## 2023-05-01 MED ORDER — NA SULFATE-K SULFATE-MG SULF 17.5-3.13-1.6 GM/177ML PO SOLN: 1.0000 | Freq: Once | ORAL | 0 refills | Status: AC

## 2023-05-01 NOTE — Telephone Encounter (Signed)
 Gastroenterology Pre-Procedure Review  Request Date: 06/14/2023 Requesting Physician: Dr. Servando Snare  PATIENT REVIEW QUESTIONS: The patient responded to the following health history questions as indicated:    1. Are you having any GI issues? no 2. Do you have a personal history of Polyps? yes (last colonoscopy was 05/30/2013) 3. Do you have a family history of Colon Cancer or Polyps? no 4. Diabetes Mellitus? no 5. Joint replacements in the past 12 months?no 6. Major health problems in the past 3 months?yes (Parathyroidectomy on 05/21/2023) 7. Any artificial heart valves, MVP, or defibrillator?no    MEDICATIONS & ALLERGIES:    Patient reports the following regarding taking any anticoagulation/antiplatelet therapy:   Plavix, Coumadin, Eliquis, Xarelto, Lovenox, Pradaxa, Brilinta, or Effient? no Aspirin? no  Patient confirms/reports the following medications:  Current Outpatient Medications  Medication Sig Dispense Refill   Calcium Carbonate-Vit D-Min (CALCIUM 1200 PO) Take by mouth.     cholecalciferol (VITAMIN D) 1000 units tablet Take 1,000 Units by mouth daily.     Multiple Vitamin (MULTIVITAMIN) tablet Take 1 tablet by mouth daily.     Phentermine-Topiramate (QSYMIA) 3.75-23 MG CP24 Take 1 capsule by mouth every morning. (Patient not taking: Reported on 04/30/2023) 30 capsule 0   Phentermine-Topiramate (QSYMIA) 7.5-46 MG CP24 Take 1 capsule by mouth once a week. After you finish the 3.75-23 dose (Patient not taking: Reported on 04/30/2023) 30 capsule 0   vitamin B-12 (CYANOCOBALAMIN) 100 MCG tablet Take 100 mcg by mouth daily.     No current facility-administered medications for this visit.    Patient confirms/reports the following allergies:  No Known Allergies  No orders of the defined types were placed in this encounter.   AUTHORIZATION INFORMATION Primary Insurance: 1D#: Group #:  Secondary Insurance: 1D#: Group #:  SCHEDULE INFORMATION: Date: 06/14/2023 Time: Location:   [facility]

## 2023-05-02 ENCOUNTER — Encounter: Payer: Self-pay | Admitting: Podiatry

## 2023-05-18 ENCOUNTER — Ambulatory Visit: Admitting: Podiatry

## 2023-05-18 ENCOUNTER — Encounter: Payer: Self-pay | Admitting: Podiatry

## 2023-05-18 DIAGNOSIS — M7662 Achilles tendinitis, left leg: Secondary | ICD-10-CM | POA: Diagnosis not present

## 2023-05-18 DIAGNOSIS — M7732 Calcaneal spur, left foot: Secondary | ICD-10-CM

## 2023-05-18 NOTE — Progress Notes (Signed)
 Chief Complaint  Patient presents with   Foot Pain    Patient is here for surgery consult    HPI: 58 y.o. female presenting today for follow-up evaluation of chronic pain and tenderness associated to the left posterior heel.  Unfortunately since last visit there has been no improvement.  She continues to have pain on a daily basis.  She has pursued multiple conservative modalities without any improvement of her symptoms.  She says that now she is in a position that she would like to pursue surgery   Brief history: This is been ongoing for a few years now.  Idiopathic onset.  Denies any history of injury.  Unfortunately the patient has tried multiple conservative modalities including shoe gear modifications over the past few years and stretching exercises with no relief of her symptoms.  Last visit on 09/12/2022 cortisone injection was administered as well as oral anti-inflammatory.  She says that there was some relief of her symptoms but she continues to have some persistent heel pain.   Past Medical History:  Diagnosis Date   Abnormal uterine bleeding    Anemia    Hemangioma    on liver   History of uterine fibroid    Knee pain, right    Leukopenia    Lump or mass in breast    Malaise and fatigue    Vitamin B 12 deficiency    Vitamin D deficiency     Past Surgical History:  Procedure Laterality Date   ABDOMINAL HYSTERECTOMY     BILATERAL SALPINGECTOMY Bilateral 01/04/2015   Procedure: BILATERAL SALPINGECTOMY;  Surgeon: Hildred Laser, MD;  Location: [facility];  Service: Gynecology;  Laterality: Bilateral;   BREAST BIOPSY Left 02/24/2019   u/s bx/ neg   CYSTOSCOPY N/A 01/04/2015   Procedure: CYSTOSCOPY;  Surgeon: Hildred Laser, MD;  Location: [facility];  Service: Gynecology;  Laterality: N/A;   MOUTH SURGERY     ROBOTIC ASSISTED TOTAL HYSTERECTOMY N/A 01/04/2015   Procedure: ROBOTIC ASSISTED TOTAL HYSTERECTOMY;  Surgeon: Hildred Laser, MD;  Location: [facility];  Service: Gynecology;   Laterality: N/A;   TUBAL LIGATION      No Known Allergies   Physical Exam: General: The patient is alert and oriented x3 in no acute distress.  Dermatology: Skin is warm, dry and supple bilateral lower extremities. Negative for open lesions or macerations.  Vascular: Palpable pedal pulses bilaterally. No edema or erythema noted. Capillary refill within normal limits.  Neurological: Grossly intact via light touch  Musculoskeletal Exam: There continues to be chronic pain on palpation noted to the posterior tubercle of the left calcaneus at the insertion of the Achilles tendon consistent with retrocalcaneal bursitis. Range of motion within normal limits. Muscle strength 5/5 in all muscle groups bilateral lower extremities.  Radiographic Exam LT foot 09/12/2022:  Posterior calcaneal spur noted to the respective calcaneus on lateral view. No fracture or dislocation noted. Normal osseous mineralization noted.     Assessment: 1. Insertional Achilles tendinitis left 2.  Posterior heel spur left  Plan of Care:  -Patient was evaluated.  - Today again we discussed in detail surgical correction for the posterior heel spur as well as Achilles tendinitis to the left lower extremity.  Risk benefits advantages and disadvantages as well as the postoperative recovery course were explained in detail to the patient.  No guarantees were expressed or implied.  She understands that she will be nonweightbearing for a period of 4-6 weeks postoperatively followed by a long postoperative recovery.  All patient questions were answered.  No guarantees were expressed or implied -Authorization for surgery was reinitiated today.  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 Felecia Shelling, DPM    [address] [place], [place] [address]                Office [phone]  Fax [phone]

## 2023-05-21 ENCOUNTER — Telehealth: Payer: Self-pay | Admitting: Urology

## 2023-05-21 DIAGNOSIS — D351 Benign neoplasm of parathyroid gland: Secondary | ICD-10-CM | POA: Diagnosis not present

## 2023-05-21 DIAGNOSIS — Z6841 Body Mass Index (BMI) 40.0 and over, adult: Secondary | ICD-10-CM | POA: Diagnosis not present

## 2023-05-21 DIAGNOSIS — M179 Osteoarthritis of knee, unspecified: Secondary | ICD-10-CM | POA: Diagnosis not present

## 2023-05-21 DIAGNOSIS — E210 Primary hyperparathyroidism: Secondary | ICD-10-CM | POA: Diagnosis not present

## 2023-05-21 NOTE — Telephone Encounter (Signed)
 Received sx papers from [place] office, called and LM for pt to call back to schedule sx with Dr. Logan Bores.

## 2023-05-22 ENCOUNTER — Telehealth: Payer: Self-pay | Admitting: Podiatry

## 2023-05-22 NOTE — Telephone Encounter (Signed)
 Pt left message today at 1150am stating she was returning a call to get scheduled for surgery.  I returned call and left message for pt to call me back to get scheduled for surgery. Note says possibly 5/15

## 2023-05-22 NOTE — Telephone Encounter (Signed)
 Pt called back and is scheduled for surgery 6/12 and is seeing her pcp for clearance on 07/31/23

## 2023-06-05 DIAGNOSIS — E559 Vitamin D deficiency, unspecified: Secondary | ICD-10-CM | POA: Diagnosis not present

## 2023-06-05 DIAGNOSIS — E210 Primary hyperparathyroidism: Secondary | ICD-10-CM | POA: Diagnosis not present

## 2023-06-12 DIAGNOSIS — E559 Vitamin D deficiency, unspecified: Secondary | ICD-10-CM | POA: Diagnosis not present

## 2023-06-12 DIAGNOSIS — Z6841 Body Mass Index (BMI) 40.0 and over, adult: Secondary | ICD-10-CM | POA: Diagnosis not present

## 2023-06-12 DIAGNOSIS — E210 Primary hyperparathyroidism: Secondary | ICD-10-CM | POA: Diagnosis not present

## 2023-06-13 ENCOUNTER — Encounter: Payer: Self-pay | Admitting: Gastroenterology

## 2023-06-14 ENCOUNTER — Ambulatory Visit
Admission: RE | Admit: 2023-06-14 | Discharge: 2023-06-14 | Disposition: A | Discharge status: Home / Self Care | Attending: Gastroenterology | Admitting: Gastroenterology

## 2023-06-14 ENCOUNTER — Encounter
Admission: RE | Disposition: A | Discharge status: Home / Self Care | Payer: Self-pay | Source: Home / Self Care | Attending: Gastroenterology

## 2023-06-14 ENCOUNTER — Ambulatory Visit: Admitting: Certified Registered"

## 2023-06-14 ENCOUNTER — Encounter: Payer: Self-pay | Admitting: Gastroenterology

## 2023-06-14 DIAGNOSIS — Z87891 Personal history of nicotine dependence: Secondary | ICD-10-CM | POA: Diagnosis not present

## 2023-06-14 DIAGNOSIS — Z860101 Personal history of adenomatous and serrated colon polyps: Secondary | ICD-10-CM | POA: Insufficient documentation

## 2023-06-14 DIAGNOSIS — Z6841 Body Mass Index (BMI) 40.0 and over, adult: Secondary | ICD-10-CM | POA: Insufficient documentation

## 2023-06-14 DIAGNOSIS — Z1211 Encounter for screening for malignant neoplasm of colon: Secondary | ICD-10-CM | POA: Diagnosis not present

## 2023-06-14 DIAGNOSIS — K640 First degree hemorrhoids: Secondary | ICD-10-CM | POA: Insufficient documentation

## 2023-06-14 DIAGNOSIS — Z860100 Personal history of colon polyps, unspecified: Secondary | ICD-10-CM | POA: Diagnosis not present

## 2023-06-14 DIAGNOSIS — E7800 Pure hypercholesterolemia, unspecified: Secondary | ICD-10-CM | POA: Diagnosis not present

## 2023-06-14 DIAGNOSIS — E66813 Obesity, class 3: Secondary | ICD-10-CM | POA: Insufficient documentation

## 2023-06-14 HISTORY — PX: COLONOSCOPY: SHX5424

## 2023-06-14 SURGERY — COLONOSCOPY
Anesthesia: General

## 2023-06-14 MED ORDER — LIDOCAINE 2% (20 MG/ML) 5 ML SYRINGE
INTRAMUSCULAR | Status: DC | PRN
  Administered 2023-06-14: 20 mg via INTRAVENOUS

## 2023-06-14 MED ORDER — PROPOFOL 500 MG/50ML IV EMUL
INTRAVENOUS | Status: DC | PRN
  Administered 2023-06-14: 120 ug/kg/min via INTRAVENOUS

## 2023-06-14 MED ORDER — PROPOFOL 10 MG/ML IV BOLUS
INTRAVENOUS | Status: DC | PRN
  Administered 2023-06-14: 100 mg via INTRAVENOUS

## 2023-06-14 MED ORDER — SODIUM CHLORIDE 0.9 % IV SOLN
INTRAVENOUS | Status: DC
Start: 2023-06-14 — End: 2023-06-14

## 2023-06-14 NOTE — H&P (Signed)
 Marnee Sink, MD [facility] [address] [place], [place] [address] Phone: [phone] Fax : [phone]  Primary Care Physician:  Arleen Lacer, MD Primary Gastroenterologist:  Dr. Ole Berkeley  Pre-Procedure History & Physical: HPI:  Sally Lewis is a 58 y.o. female is here for a screening colonoscopy.   Past Medical History:  Diagnosis Date   Abnormal uterine bleeding    Anemia    Hemangioma    on liver   History of uterine fibroid    Knee pain, right    Leukopenia    Lump or mass in breast    Malaise and fatigue    Vitamin B 12 deficiency    Vitamin D  deficiency     Past Surgical History:  Procedure Laterality Date   ABDOMINAL HYSTERECTOMY     BILATERAL SALPINGECTOMY Bilateral 01/04/2015   Procedure: BILATERAL SALPINGECTOMY;  Surgeon: Teresa Fender, MD;  Location: [facility] ORS;  Service: Gynecology;  Laterality: Bilateral;   BREAST BIOPSY Left 02/24/2019   u/s bx/ neg   CYSTOSCOPY N/A 01/04/2015   Procedure: CYSTOSCOPY;  Surgeon: Teresa Fender, MD;  Location: [facility] ORS;  Service: Gynecology;  Laterality: N/A;   MOUTH SURGERY     PARATHYROIDECTOMY     ROBOTIC ASSISTED TOTAL HYSTERECTOMY N/A 01/04/2015   Procedure: ROBOTIC ASSISTED TOTAL HYSTERECTOMY;  Surgeon: Teresa Fender, MD;  Location: [facility] ORS;  Service: Gynecology;  Laterality: N/A;   TUBAL LIGATION      Prior to Admission medications   Medication Sig Start Date End Date Taking? Authorizing Provider  Calcium Carbonate-Vit D-Min (CALCIUM 1200 PO) Take by mouth.    [provider]  cholecalciferol (VITAMIN D ) 1000 units tablet Take 1,000 Units by mouth daily.    [provider]  Multiple Vitamin (MULTIVITAMIN) tablet Take 1 tablet by mouth daily.    [provider]  vitamin B-12 (CYANOCOBALAMIN ) 100 MCG tablet Take 100 mcg by mouth daily.    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     Comment: 5 other universities online  Tobacco Use   Smoking status: Former    Current packs/day: 0.00    Average packs/day: 0.5 packs/day for 4.0 years (2.0 ttl pk-yrs)    Types: Cigarettes    Start date: 10/29/1983    Quit date: 10/29/1987    Years since quitting: 35.6   Smokeless tobacco: Never  Vaping Use   Vaping status: Never Used  Substance and Sexual Activity   Alcohol use: Not Currently    Comment: seldom - wine   Drug use: No   Sexual activity: Yes    Partners: Male    Birth control/protection: Surgical  Other Topics Concern   Not on file  Social History Narrative   She is married, children are grown and married.   She finihed  her PhD May 2018  in health services   Teaching online and also at [employer]    Social Drivers of Health   Financial Resource Strain: Low Risk  (10/31/2022)   Overall Financial Resource Strain (CARDIA)    Difficulty of Paying Living Expenses: Not hard at all  Food Insecurity: No Food Insecurity (10/31/2022)   Hunger Vital Sign    Worried About Running Out of Food in the Last Year: Never true    Ran Out of Food in the Last Year: Never true  Transportation Needs: No Transportation Needs (10/31/2022)   PRAPARE - Administrator, Civil Service (Medical): No    Lack of Transportation (Non-Medical): No  Physical Activity: Inactive (10/31/2022)   Exercise Vital Sign    Days of Exercise per Week: 0 days    Minutes of Exercise per Session: 0 min  Stress: No Stress Concern Present (10/31/2022)   [employer] of Occupational Health -  Occupational Stress Questionnaire    Feeling of Stress : Not at all  Social Connections: Socially Integrated (10/31/2022)   Social Connection and Isolation Panel [NHANES]    Frequency of Communication with Friends and Family: Twice a week    Frequency of Social Gatherings with Friends and Family: Once a week    Attends Religious Services: More than 4 times per year    Active Member of [employer] or Organizations: Yes    Attends Engineer, structural: More than 4 times per year    Marital Status: Married  Catering manager Violence: Not At Risk (10/31/2022)   Humiliation, Afraid, Rape, and Kick questionnaire    Fear of Current or Ex-Partner: No    Emotionally Abused: No    Physically Abused: No    Sexually Abused: No    Review of Systems: See HPI, otherwise negative ROS  Physical Exam: BP 133/86   Pulse 84   Temp (!) 96.2 F (35.7 C) (Temporal)   Resp 18   Ht 5\' 10"  (1.778 m)   Wt 136.1 kg   LMP 01/02/2015   SpO2 97%   BMI 43.05 kg/m  General:   Alert,  pleasant and cooperative in NAD Head:  Normocephalic and atraumatic. Neck:  Supple; no masses or thyromegaly. Lungs:  Clear throughout to auscultation.    Heart:  Regular rate and rhythm. Abdomen:  Soft, nontender and nondistended. Normal bowel sounds, without guarding, and without rebound.   Neurologic:  Alert and  oriented x4;  grossly normal neurologically.  Impression/Plan: Sally Lewis is now here to undergo a screening colonoscopy.  Risks, benefits, and alternatives regarding colonoscopy have been reviewed with the patient.  Questions have been answered.  All parties agreeable.

## 2023-06-14 NOTE — Transfer of Care (Signed)
 Immediate Anesthesia Transfer of Care Note  Patient: Sally Lewis  Procedure(s) Performed: COLONOSCOPY  Patient Location: Endoscopy Unit  Anesthesia Type:General  Level of Consciousness: awake  Airway & Oxygen Therapy: Patient Spontanous Breathing  Post-op Assessment: Report given to RN and Post -op Vital signs reviewed and stable  Post vital signs: Reviewed  Last Vitals:  Vitals Value Taken Time  BP 100/72 06/14/23 0907  Temp    Pulse 85 06/14/23 0907  Resp 16 06/14/23 0907  SpO2 99 % 06/14/23 0907  Vitals shown include unfiled device data.  Last Pain:  Vitals:   06/14/23 0907  TempSrc:   PainSc: Asleep         Complications: No notable events documented.

## 2023-06-14 NOTE — Anesthesia Preprocedure Evaluation (Signed)
 Anesthesia Evaluation  Patient identified by MRN, date of birth, ID band Patient awake    Reviewed: Allergy & Precautions, H&P , NPO status , Patient's Chart, lab work & pertinent test results, reviewed documented beta blocker date and time   History of Anesthesia Complications Negative for: history of anesthetic complications  Airway Mallampati: III  TM Distance: >3 FB Neck ROM: full    Dental  (+) Dental Advidsory Given, Caps, Teeth Intact, Missing   Pulmonary neg pulmonary ROS, former smoker   Pulmonary exam normal breath sounds clear to auscultation       Cardiovascular Exercise Tolerance: Good negative cardio ROS Normal cardiovascular exam Rhythm:regular Rate:Normal     Neuro/Psych negative neurological ROS  negative psych ROS   GI/Hepatic negative GI ROS, Neg liver ROS,,,  Endo/Other  neg diabetes  Class 3 obesity  Renal/GU negative Renal ROS  negative genitourinary   Musculoskeletal   Abdominal   Peds  Hematology negative hematology ROS (+)   Anesthesia Other Findings Past Medical History: No date: Abnormal uterine bleeding No date: Anemia No date: Hemangioma     Comment:  on liver No date: History of uterine fibroid No date: Knee pain, right No date: Leukopenia No date: Lump or mass in breast No date: Malaise and fatigue No date: Vitamin B 12 deficiency No date: Vitamin D  deficiency   Reproductive/Obstetrics negative OB ROS                             Anesthesia Physical Anesthesia Plan  ASA: 3  Anesthesia Plan: General   Post-op Pain Management:    Induction: Intravenous  PONV Risk Score and Plan: 3 and Propofol  infusion and TIVA  Airway Management Planned: Natural Airway and Nasal Cannula  Additional Equipment:   Intra-op Plan:   Post-operative Plan:   Informed Consent: I have reviewed the patients History and Physical, chart, labs and discussed the  procedure including the risks, benefits and alternatives for the proposed anesthesia with the patient or authorized representative who has indicated his/her understanding and acceptance.     Dental Advisory Given  Plan Discussed with: Anesthesiologist, CRNA and Surgeon  Anesthesia Plan Comments:        Anesthesia Quick Evaluation

## 2023-06-14 NOTE — Op Note (Signed)
 [facility] Gastroenterology Patient Name: Sally Lewis Procedure Date: 06/14/2023 8:43 AM MRN: [id number] Account #: [id number] Date of Birth: [birth year] Admit Type: Outpatient Age: 58 Room: [facility] ENDO ROOM 4 Gender: Female Note Status: Finalized Instrument Name: Hyman Main [id number] Procedure:             Colonoscopy Indications:           High risk colon cancer surveillance: Personal history                         of colonic polyps Providers:             Marnee Sink MD, MD Referring MD:          Lavonna Prader. Sowles, MD (Referring MD) Medicines:             Propofol  per Anesthesia Complications:         No immediate complications. Procedure:             Pre-Anesthesia Assessment:                        - Prior to the procedure, a History and Physical was                         performed, and patient medications and allergies were                         reviewed. The patient's tolerance of previous                         anesthesia was also reviewed. The risks and benefits                         of the procedure and the sedation options and risks                         were discussed with the patient. All questions were                         answered, and informed consent was obtained. Prior                         Anticoagulants: The patient has taken no anticoagulant                         or antiplatelet agents. ASA Grade Assessment: II - A                         patient with mild systemic disease. After reviewing                         the risks and benefits, the patient was deemed in                         satisfactory condition to undergo the procedure.                        After obtaining informed consent, the colonoscope was  passed under direct vision. Throughout the procedure,                         the patient's blood pressure, pulse, and oxygen                         saturations were monitored continuously. The                          Colonoscope was introduced through the anus and                         advanced to the the cecum, identified by appendiceal                         orifice and ileocecal valve. The colonoscopy was                         performed without difficulty. The patient tolerated                         the procedure well. The quality of the bowel                         preparation was excellent. Findings:      The perianal and digital rectal examinations were normal.      Non-bleeding internal hemorrhoids were found during retroflexion. The       hemorrhoids were Grade I (internal hemorrhoids that do not prolapse). Impression:            - Non-bleeding internal hemorrhoids.                        - No specimens collected. Recommendation:        - Discharge patient to home.                        - Resume previous diet.                        - Continue present medications.                        - Repeat colonoscopy in 7 years for surveillance. Procedure Code(s):     --- Professional ---                        [phone], Colonoscopy, flexible; diagnostic, including                         collection of specimen(s) by brushing or washing, when                         performed (separate procedure) Diagnosis Code(s):     --- Professional ---                        Z86.010, Personal history of colonic polyps CPT copyright 2022 American Medical Association. All rights reserved. The codes documented in this report are preliminary and upon coder review may  be revised to meet current compliance requirements. Lache Dagher  Doryce Mcgregory MD, MD 06/14/2023 9:04:17 AM This report has been signed electronically. Number of Addenda: 0 Note Initiated On: 06/14/2023 8:43 AM Scope Withdrawal Time: 0 hours 9 minutes 18 seconds  Total Procedure Duration: 0 hours 13 minutes 10 seconds  Estimated Blood Loss:  Estimated blood loss: none.      [facility]

## 2023-06-15 ENCOUNTER — Encounter: Payer: Self-pay | Admitting: Gastroenterology

## 2023-06-19 NOTE — Anesthesia Postprocedure Evaluation (Signed)
 Anesthesia Post Note  Patient: Sally Lewis  Procedure(s) Performed: COLONOSCOPY  Patient location during evaluation: Endoscopy Anesthesia Type: General Level of consciousness: awake and alert Pain management: pain level controlled Vital Signs Assessment: post-procedure vital signs reviewed and stable Respiratory status: spontaneous breathing, nonlabored ventilation, respiratory function stable and patient connected to nasal cannula oxygen Cardiovascular status: blood pressure returned to baseline and stable Postop Assessment: no apparent nausea or vomiting Anesthetic complications: no   No notable events documented.   Last Vitals:  Vitals:   06/14/23 0813 06/14/23 0927  BP: 133/86   Pulse: 84 81  Resp: 18 (!) 21  Temp: (!) 35.7 C   SpO2: 97% 100%    Last Pain:  Vitals:   06/15/23 0747  TempSrc:   PainSc: 0-No pain                 Vanice Genre

## 2023-07-19 ENCOUNTER — Encounter: Payer: Self-pay | Admitting: Podiatry

## 2023-07-25 ENCOUNTER — Encounter: Payer: Self-pay | Admitting: Podiatry

## 2023-07-26 ENCOUNTER — Telehealth: Payer: Self-pay | Admitting: Podiatry

## 2023-07-26 NOTE — Telephone Encounter (Signed)
 Per Ed C no auth required for copt 78295 and 28118. Out of pocket is 7000.00 has been met Deductible 3500 has been met Services covered at 100% since meeting out of pocket.  Ref # [id number]  07/26/23

## 2023-07-27 ENCOUNTER — Telehealth: Payer: Self-pay | Admitting: Podiatry

## 2023-07-27 ENCOUNTER — Encounter: Payer: Self-pay | Admitting: Family Medicine

## 2023-07-27 ENCOUNTER — Ambulatory Visit (INDEPENDENT_AMBULATORY_CARE_PROVIDER_SITE_OTHER): Admitting: Family Medicine

## 2023-07-27 VITALS — BP 112/70 | HR 104 | Resp 16 | Ht 70.0 in | Wt 306.5 lb

## 2023-07-27 DIAGNOSIS — Z01818 Encounter for other preprocedural examination: Secondary | ICD-10-CM

## 2023-07-27 DIAGNOSIS — M7732 Calcaneal spur, left foot: Secondary | ICD-10-CM | POA: Diagnosis not present

## 2023-07-27 NOTE — Telephone Encounter (Signed)
 PT CALLED A LITTLE UPSET SHE SENT A MESSAGE THRU MY CHART ON 5/29 ASKING ABOUT HOW LONG SHE WILL BE OUT OF WORK FOR HER SURGERY ON 6/12 AND SHE HAS NOT HEARD BACK.  PLEASE CALL OR MESSAGE PT ASAP

## 2023-07-27 NOTE — Progress Notes (Signed)
 Name: Sally Lewis   MRN: [id number]    DOB: 09-29-1965   Date:07/27/2023       Progress Note  Subjective  Chief Complaint  Chief Complaint  Patient presents with   Pre-op Exam   Discussed the use of AI scribe software for clinical note transcription with the patient, who gave verbal consent to proceed.  History of Present Illness Sally BALDRIDGE "Pam" is a 58 year old female who presents for preoperative clearance for left heel surgery.  She is scheduled for surgery on August 02, 2023, to remove a bone spur on her left heel, which will be performed under general anesthesia. She just had parathyroid surgery under general anesthesia without complications.  She experiences heel pain rated at 6 to 7 out of 10, persisting for three to four years, associated with a posterior calcaneus spur and Achilles tendonitis. The pain worsens in damp or cool weather, particularly during winter, and limits her ability to walk.  Her past medical history includes a recent parathyroid surgery, which she tolerated well postoperatively. She has a history of high cholesterol, with her last LDL level recorded at 140 mg/dL in September of the previous year. She does not take medication for blood pressure and has no history of clots.  No chest pain, palpitations, or shortness of breath. She can perform physical activities such as climbing a flight of stairs without difficulty. She has not undergone a sleep study and does not experience symptoms suggestive of sleep apnea.    The 10-year ASCVD risk score (Arnett DK, et al., 2019) is: 3.7%   Values used to calculate the score:     Age: 1 years     Sex: Female     Is Non-Hispanic African American: Yes     Diabetic: No     Tobacco smoker: No     Systolic Blood Pressure: 112 mmHg     Is BP treated: No     HDL Cholesterol: 59 mg/dL     Total Cholesterol: 222 mg/dL  Patient Active Problem List   Diagnosis Date Noted   History of colon polyps 06/14/2023   History of  iron deficiency anemia 04/30/2023   Pure hypercholesterolemia 04/30/2023   Morbid obesity (HCC) 04/30/2023   Primary hyperparathyroidism (HCC) 09/21/2021   Chronic idiopathic neutropenia (HCC) 09/02/2020   History of uterine fibroid 01/01/2015   Vitamin D  deficiency 10/13/2014   Knee osteoarthritis 10/13/2014   Obesity (BMI 30-39.9) 10/13/2014   B12 deficiency 10/13/2014   Liver hemangioma 10/13/2014    Past Surgical History:  Procedure Laterality Date   ABDOMINAL HYSTERECTOMY     BILATERAL SALPINGECTOMY Bilateral 01/04/2015   Procedure: BILATERAL SALPINGECTOMY;  Surgeon: Teresa Fender, MD;  Location: [facility] ORS;  Service: Gynecology;  Laterality: Bilateral;   BREAST BIOPSY Left 02/24/2019   u/s bx/ neg   COLONOSCOPY N/A 06/14/2023   Procedure: COLONOSCOPY;  Surgeon: Marnee Sink, MD;  Location: [facility] ENDOSCOPY;  Service: Endoscopy;  Laterality: N/A;   CYSTOSCOPY N/A 01/04/2015   Procedure: CYSTOSCOPY;  Surgeon: Teresa Fender, MD;  Location: [facility] ORS;  Service: Gynecology;  Laterality: N/A;   MOUTH SURGERY     PARATHYROIDECTOMY     ROBOTIC ASSISTED TOTAL HYSTERECTOMY N/A 01/04/2015   Procedure: ROBOTIC ASSISTED TOTAL HYSTERECTOMY;  Surgeon: Teresa Fender, MD;  Location: [facility] ORS;  Service: Gynecology;  Laterality: N/A;   TUBAL LIGATION      Family History  Problem Relation Age of Onset   Hypertension Mother    Cancer Father  Prostate   Sickle cell anemia Sister    Sickle cell anemia Brother    Breast cancer Neg Hx     Social History   Tobacco Use   Smoking status: Former    Current packs/day: 0.00    Average packs/day: 0.5 packs/day for 4.0 years (2.0 ttl pk-yrs)    Types: Cigarettes    Start date: 10/29/1983    Quit date: 10/29/1987    Years since quitting: 35.7   Smokeless tobacco: Never  Substance Use Topics   Alcohol use: Not Currently    Comment: seldom - wine     Current Outpatient Medications:    Calcium Carbonate-Vit D-Min (CALCIUM 1200 PO), Take by  mouth., Disp: , Rfl:    cholecalciferol (VITAMIN D ) 1000 units tablet, Take 1,000 Units by mouth daily., Disp: , Rfl:    Multiple Vitamin (MULTIVITAMIN) tablet, Take 1 tablet by mouth daily., Disp: , Rfl:    vitamin B-12 (CYANOCOBALAMIN ) 100 MCG tablet, Take 100 mcg by mouth daily., Disp: , Rfl:   No Known Allergies  I personally reviewed active problem list, medication list, allergies with the patient/caregiver today.   ROS  Ten systems reviewed and is negative except as mentioned in HPI    Objective Physical Exam CONSTITUTIONAL: Patient appears well-developed and well-nourished. No distress. HEENT: Head atraumatic, normocephalic, neck supple. CARDIOVASCULAR: Normal rate, regular rhythm and normal heart sounds. No murmur heard. Trace  BLE edema. PULMONARY: Effort normal and breath sounds normal. Lungs clear to auscultation. No respiratory distress. ABDOMINAL: There is no tenderness or distention. MUSCULOSKELETAL: some left ankle swelling when compared to right side  PSYCHIATRIC: Patient has a normal mood and affect. Behavior is normal. Judgment and thought content normal.  Vitals:   07/27/23 1404 07/27/23 1407  BP: 112/70   Pulse: (!) 110 (!) 104  Resp: 16   SpO2: 97%   Weight: (!) 306 lb 8 oz (139 kg)   Height: 5\' 10"  (1.778 m)     Body mass index is 43.98 kg/m.   PHQ2/9:    07/27/2023    1:58 PM 04/30/2023    3:11 PM 02/12/2023   11:36 AM 10/31/2022    2:38 PM 09/09/2021   10:23 AM  Depression screen PHQ 2/9  Decreased Interest 0 0 0 0 0  Down, Depressed, Hopeless 0 0 0 0 0  PHQ - 2 Score 0 0 0 0 0  Altered sleeping  0 0 0   Tired, decreased energy  0 0 0   Change in appetite  0 0 0   Feeling bad or failure about yourself   0 0 0   Trouble concentrating  0 0 0   Moving slowly or fidgety/restless  0 0 0   Suicidal thoughts  0 0 0   PHQ-9 Score  0 0 0   Difficult doing work/chores  Not difficult at all Not difficult at all      phq 9 is negative  Fall  Risk:    07/27/2023    1:58 PM 02/12/2023   11:33 AM 10/31/2022    2:38 PM 09/09/2021   10:23 AM 06/04/2019   10:10 AM  Fall Risk   Falls in the past year? 0 0 0 0 0  Number falls in past yr: 0 0 0 0 0  Injury with Fall? 0 0 0 0 0  Risk for fall due to : No Fall Risks No Fall Risks No Fall Risks No Fall Risks   Follow up Falls  prevention discussed;Education provided;Falls evaluation completed Falls prevention discussed;Education provided;Falls evaluation completed Falls prevention discussed Falls prevention discussed       Assessment & Plan Preoperative Clearance for Left Heel Calcaneus Spur Surgery Cleared for surgery. Low cardiac risk. Discussed post-surgery activity for weight loss and health improvement. - Fax preoperative clearance form to Dr. Luster Salters' office. - Advise increased physical activity post-surgery for weight loss and health improvement.  Hyperlipidemia LDL at 140 mg/dL. 10-year cardiovascular risk at 3.7%, not warranting medication.  Leukopenia White blood cell count at 13.4. No anemia or kidney issues. No action required.  General Health Maintenance Encouraged active lifestyle for weight loss and health improvement. - Encourage increased physical activity, such as walking, post-surgery.

## 2023-07-31 ENCOUNTER — Ambulatory Visit: Admitting: Family Medicine

## 2023-07-31 DIAGNOSIS — E210 Primary hyperparathyroidism: Secondary | ICD-10-CM | POA: Diagnosis not present

## 2023-08-02 ENCOUNTER — Other Ambulatory Visit: Payer: Self-pay | Admitting: Podiatry

## 2023-08-02 DIAGNOSIS — M7662 Achilles tendinitis, left leg: Secondary | ICD-10-CM | POA: Diagnosis not present

## 2023-08-02 DIAGNOSIS — G8918 Other acute postprocedural pain: Secondary | ICD-10-CM | POA: Diagnosis not present

## 2023-08-02 DIAGNOSIS — M7732 Calcaneal spur, left foot: Secondary | ICD-10-CM | POA: Diagnosis not present

## 2023-08-02 MED ORDER — OXYCODONE-ACETAMINOPHEN 5-325 MG PO TABS: 1.0000 | ORAL_TABLET | ORAL | 0 refills | Status: AC | PRN

## 2023-08-02 MED ORDER — IBUPROFEN 800 MG PO TABS: 800.0000 mg | ORAL_TABLET | Freq: Three times a day (TID) | ORAL | 1 refills | Status: AC

## 2023-08-02 NOTE — Progress Notes (Signed)
 PRN postop

## 2023-08-08 ENCOUNTER — Ambulatory Visit (INDEPENDENT_AMBULATORY_CARE_PROVIDER_SITE_OTHER): Admitting: Podiatry

## 2023-08-08 ENCOUNTER — Ambulatory Visit (INDEPENDENT_AMBULATORY_CARE_PROVIDER_SITE_OTHER)

## 2023-08-08 ENCOUNTER — Encounter: Payer: Self-pay | Admitting: Podiatry

## 2023-08-08 VITALS — BP 137/92 | HR 81 | Temp 97.8°F | Resp 18 | Ht 70.0 in | Wt 306.5 lb

## 2023-08-08 DIAGNOSIS — M7732 Calcaneal spur, left foot: Secondary | ICD-10-CM

## 2023-08-08 NOTE — Progress Notes (Addendum)
  Subjective:  Patient ID: Sally Lewis, female    DOB: 02-26-1965,  MRN: [id number]  Chief Complaint  Patient presents with   Routine Post Op    POST OP - POV #1 DOS 08/02/23 LT ACHILLES TENDON REPAIR, LT HEEL SPUR RESECTION, pt states her foot is slowly starting to feel better states she is taking less pain medication.     58 y.o. female returns for post-op check.  Doing well cast is comfortable  Review of Systems: Negative except as noted in the HPI. Denies N/V/F/Ch.   Objective:   Vitals:   08/08/23 1344  BP: (!) 137/92  Pulse: 81  Resp: 18  Temp: 97.8 F (36.6 C)  SpO2: 98%   Body mass index is 43.98 kg/m. Constitutional Well developed. Well nourished.  Vascular Foot warm and well perfused. Capillary refill normal to all digits.  Calf is soft and supple, no posterior calf or knee pain, negative Homans' sign  Neurologic Normal speech. Oriented to person, place, and time. Epicritic sensation to light touch grossly present bilaterally.  Dermatologic Her cast is clean dry intact without signs of weightbearing  Orthopedic: Tenderness to palpation noted about the surgical site.  Posterior heel pain only   Multiple view plain film radiographs: Interval removal of posterior spur Assessment:   1. Heel spur, left    Plan:  Patient was evaluated and treated and all questions answered.  S/p foot surgery left -Progressing as expected post-operatively.  Cast remain intact for 1 more week for suture removal next week with Dr. Luster Salters.  Pain is well-controlled.  Continue nonweightbearing.  Continue ice and elevation.   No follow-ups on file.

## 2023-08-17 ENCOUNTER — Ambulatory Visit (INDEPENDENT_AMBULATORY_CARE_PROVIDER_SITE_OTHER): Admitting: Podiatry

## 2023-08-17 ENCOUNTER — Encounter: Payer: Self-pay | Admitting: Podiatry

## 2023-08-17 VITALS — Ht 70.0 in | Wt 306.5 lb

## 2023-08-17 DIAGNOSIS — M7732 Calcaneal spur, left foot: Secondary | ICD-10-CM

## 2023-08-17 NOTE — Progress Notes (Signed)
   Chief Complaint  Patient presents with   Routine Post Op    POV #2 DOS 08/02/23 LT ACHILLES TENDON REPAIR, LT HEEL SPUR RESECTION, pt states everything is going well.    Subjective:  Patient presents today status post posterior heel spur resection with repair of Achilles tendon left.  DOS: 08/02/2023.  Doing well.  No pain.  NWB in the cast as instructed  Past Medical History:  Diagnosis Date   Abnormal uterine bleeding    Anemia    Hemangioma    on liver   History of uterine fibroid    Knee pain, right    Leukopenia    Lump or mass in breast    Malaise and fatigue    Vitamin B 12 deficiency    Vitamin D  deficiency     Past Surgical History:  Procedure Laterality Date   ABDOMINAL HYSTERECTOMY     BILATERAL SALPINGECTOMY Bilateral 01/04/2015   Procedure: BILATERAL SALPINGECTOMY;  Surgeon: Archie Savers, MD;  Location: [facility];  Service: Gynecology;  Laterality: Bilateral;   BREAST BIOPSY Left 02/24/2019   u/s bx/ neg   COLONOSCOPY N/A 06/14/2023   Procedure: COLONOSCOPY;  Surgeon: Jinny Carmine, MD;  Location: [facility]COPY;  Service: Endoscopy;  Laterality: N/A;   CYSTOSCOPY N/A 01/04/2015   Procedure: CYSTOSCOPY;  Surgeon: Archie Savers, MD;  Location: [facility];  Service: Gynecology;  Laterality: N/A;   MOUTH SURGERY     PARATHYROIDECTOMY     ROBOTIC ASSISTED TOTAL HYSTERECTOMY N/A 01/04/2015   Procedure: ROBOTIC ASSISTED TOTAL HYSTERECTOMY;  Surgeon: Archie Savers, MD;  Location: [facility];  Service: Gynecology;  Laterality: N/A;   TUBAL LIGATION      No Known Allergies  Objective/Physical Exam Cast was left intact today.  Capillary refill to the toes WNL.  She is able to wiggle her toes.  There is no abrasions to the skin.  She states that the cast is comfortable   Assessment: 1. s/p posterior heel spur resection with repair of Achilles left. DOS: 08/02/2023   Plan of Care:  -Patient was evaluated.  -Cast was left intact today -Continue NWB in the cast using the  walker for an additional 2 weeks -Return to clinic 2 weeks cast removal and follow-up x-rays  Thresa EMERSON Sar, DPM [facility]  Dr. Thresa EMERSON Sar, DPM    [address] [place], [place] [address]                Office [phone]  Fax [phone]

## 2023-09-04 ENCOUNTER — Ambulatory Visit (INDEPENDENT_AMBULATORY_CARE_PROVIDER_SITE_OTHER): Admitting: Podiatry

## 2023-09-04 ENCOUNTER — Ambulatory Visit (INDEPENDENT_AMBULATORY_CARE_PROVIDER_SITE_OTHER)

## 2023-09-04 VITALS — Ht 70.0 in | Wt 306.5 lb

## 2023-09-04 DIAGNOSIS — M7732 Calcaneal spur, left foot: Secondary | ICD-10-CM

## 2023-09-04 NOTE — Progress Notes (Signed)
   Chief Complaint  Patient presents with   Routine Post Op    POV #3 DOS 08/02/23 LT ACHILLES TENDON REPAIR, LT HEEL SPUR RESECTION, pt is here for routine post op visit she states her foot feels fine and ready to get her cast off.    Subjective:  Patient presents today status post posterior heel spur resection with repair of Achilles tendon left.  DOS: 08/02/2023.  Doing well.  No pain.  NWB in the cast as instructed  Past Medical History:  Diagnosis Date   Abnormal uterine bleeding    Anemia    Hemangioma    on liver   History of uterine fibroid    Knee pain, right    Leukopenia    Lump or mass in breast    Malaise and fatigue    Vitamin B 12 deficiency    Vitamin D  deficiency     Past Surgical History:  Procedure Laterality Date   ABDOMINAL HYSTERECTOMY     BILATERAL SALPINGECTOMY Bilateral 01/04/2015   Procedure: BILATERAL SALPINGECTOMY;  Surgeon: Archie Savers, MD;  Location: [facility];  Service: Gynecology;  Laterality: Bilateral;   BREAST BIOPSY Left 02/24/2019   u/s bx/ neg   COLONOSCOPY N/A 06/14/2023   Procedure: COLONOSCOPY;  Surgeon: Jinny Carmine, MD;  Location[facility] END[facility]COPY;  Service: Endoscopy;  Laterality: N/A;   CYSTOSCOPY N/A 01/04/2015   Procedure: CYSTOSCOPY;  Surgeon: Archie Savers, MD;  Location: [facility];  Service: Gynecology;  Laterality: N/A;   MOUTH SURGERY     PARATHYROIDECTOMY     ROBOTIC ASSISTED TOTAL HYSTERECTOMY N/A 01/04/2015   Procedure: ROBOTIC ASSISTED TOTAL HYSTERECTOMY;  Surgeon: Archie Savers, MD;  Location: [facility];  Service: Gynecology;  Laterality: N/A;   TUBAL LIGATION      No Known Allergies  Objective/Physical Exam Cast removed today.  Incision is well coapted with sutures intact.  Routine healing noted.  Radiographic exam LT foot 09/04/2023: Interval resection of the spur noted to the posterior tubercle of the calcaneus  Assessment: 1. s/p posterior heel spur resection with repair of Achilles left. DOS: 08/02/2023  Plan of  Care:  -Patient was evaluated.  The fiberglass cast was bivalved and removed today - Sutures removed -Cam boot dispensed with internal heel pads to keep the foot in slight plantarflexion.  Continue NWB for an additional 2 weeks.  After that she may begin weightbearing in the cam boot with a heel lift using the walker -Return to clinic 4 weeks  Thresa EMERSON Sar, DPM [facility]  Dr. Thresa EMERSON Sar, DPM    [address] [place], [place] [address]                Office [phone]  Fax [phone]

## 2023-10-02 ENCOUNTER — Ambulatory Visit (INDEPENDENT_AMBULATORY_CARE_PROVIDER_SITE_OTHER): Admitting: Podiatry

## 2023-10-02 ENCOUNTER — Encounter: Payer: Self-pay | Admitting: Podiatry

## 2023-10-02 VITALS — Ht 70.0 in | Wt 306.5 lb

## 2023-10-02 DIAGNOSIS — M7662 Achilles tendinitis, left leg: Secondary | ICD-10-CM

## 2023-10-02 NOTE — Progress Notes (Signed)
   Chief Complaint  Patient presents with   Routine Post Op    Subjective:  Patient presents today status post posterior heel spur resection with repair of Achilles tendon left.  DOS: 08/02/2023.  Continues to do very well.  She is currently WBAT CAM boot  Past Medical History:  Diagnosis Date   Abnormal uterine bleeding    Anemia    Hemangioma    on liver   History of uterine fibroid    Knee pain, right    Leukopenia    Lump or mass in breast    Malaise and fatigue    Vitamin B 12 deficiency    Vitamin D  deficiency     Past Surgical History:  Procedure Laterality Date   ABDOMINAL HYSTERECTOMY     BILATERAL SALPINGECTOMY Bilateral 01/04/2015   Procedure: BILATERAL SALPINGECTOMY;  Surgeon: Archie Savers, MD;  Location: [facility];  Service: Gynecology;  Laterality: Bilateral;   BREAST BIOPSY Left 02/24/2019   u/s bx/ neg   COLONOSCOPY N/A 06/14/2023   Procedure: COLONOSCOPY;  Surgeon: Jinny Carmine, MD;  [facility];  Service: Endoscopy;  Laterality: N/A;   CYSTOSCOPY N/A 01/04/2015   Procedure: CYSTOSCOPY;  Surgeon: Archie Savers, MD;  Location: [facility];  Service: Gynecology;  Laterality: N/A;   MOUTH SURGERY     PARATHYROIDECTOMY     ROBOTIC ASSISTED TOTAL HYSTERECTOMY N/A 01/04/2015   Procedure: ROBOTIC ASSISTED TOTAL HYSTERECTOMY;  Surgeon: Archie Savers, MD;  Location: [facility];  Service: Gynecology;  Laterality: N/A;   TUBAL LIGATION      No Known Allergies  Objective/Physical Exam The majority of the incision is nicely healed with exception of the central portion of the incision site which demonstrates some healthy granular tissue after debridement of the overlying scab/eschar.  This should heal uneventfully.  There is no indication of infection  Radiographic exam LT foot 09/04/2023: Interval resection of the spur noted to the posterior tubercle of the calcaneus  Assessment: 1. s/p posterior heel spur resection with repair of Achilles left. DOS:  08/02/2023  Plan of Care:  -Patient was evaluated.   -She is now fully weightbearing in the cam boot without complication or pain. -She may now transition slowly out of the cam boot into good supportive tennis shoes over a 4-6-week. -Physical therapy offered.  If she feels that she needs assistance with gait and ambulation and strengthening she will reach out and we will send her to physical therapy.  For now she is very satisfied and doing well -Return to clinic 6 weeks follow-up x-ray  Thresa EMERSON Sar, DPM [facility]  Dr. Thresa EMERSON Sar, DPM    [address] [place], [place] [address]                Office [phone]  Fax [phone]

## 2023-11-13 ENCOUNTER — Encounter: Payer: Self-pay | Admitting: Podiatry

## 2023-11-13 ENCOUNTER — Ambulatory Visit (INDEPENDENT_AMBULATORY_CARE_PROVIDER_SITE_OTHER): Admitting: Podiatry

## 2023-11-13 ENCOUNTER — Ambulatory Visit (INDEPENDENT_AMBULATORY_CARE_PROVIDER_SITE_OTHER)

## 2023-11-13 VITALS — Ht 70.0 in | Wt 306.5 lb

## 2023-11-13 DIAGNOSIS — M7732 Calcaneal spur, left foot: Secondary | ICD-10-CM

## 2023-11-13 NOTE — Progress Notes (Signed)
   No chief complaint on file.   Subjective:  Patient presents today status post posterior heel spur resection with repair of Achilles tendon left.  DOS: 08/02/2023.  Doing very well.  No pain.  She has been increasing her activity without any issues  Past Medical History:  Diagnosis Date   Abnormal uterine bleeding    Anemia    Hemangioma    on liver   History of uterine fibroid    Knee pain, right    Leukopenia    Lump or mass in breast    Malaise and fatigue    Vitamin B 12 deficiency    Vitamin D  deficiency     Past Surgical History:  Procedure Laterality Date   ABDOMINAL HYSTERECTOMY     BILATERAL SALPINGECTOMY Bilateral 01/04/2015   Procedure: BILATERAL SALPINGECTOMY;  Surgeon: Archie Savers, MD;  Location: [facility];  Service: Gynecology;  Laterality: Bilateral;   BREAST BIOPSY Left 02/24/2019   u/s bx/ neg   COLONOSCOPY N/A 06/14/2023   Procedure: COLONOSCOPY;  Surgeon: Jinny Carmine, MD;  Location: [facility] ENDOSCOPY;  Service: Endoscopy;  Laterality: N/A;   CYSTOSCOPY N/A 01/04/2015   Procedure: CYSTOSCOPY;  Surgeon: Archie Savers, MD;  Location: [facility];  Service: Gynecology;  Laterality: N/A;   MOUTH SURGERY     PARATHYROIDECTOMY     ROBOTIC ASSISTED TOTAL HYSTERECTOMY N/A 01/04/2015   Procedure: ROBOTIC ASSISTED TOTAL HYSTERECTOMY;  Surgeon: Archie Savers, MD;  Location: [facility];  Service: Gynecology;  Laterality: N/A;   TUBAL LIGATION      No Known Allergies  Objective/Physical Exam The majority of the incision is nicely healed with exception of the central portion of the incision site which demonstrates some healthy granular tissue after debridement of the overlying scab/eschar.  This should heal uneventfully.  There is no indication of infection  Radiographic exam LT foot 09/04/2023: Interval resection of the spur noted to the posterior tubercle of the calcaneus  Assessment: 1. s/p posterior heel spur resection with repair of Achilles left. DOS: 08/02/2023  Plan  of Care:  -Patient was evaluated.   - Okay to slowly increase to full activity no restrictions -With any new activity recommend a slow transition back to activity -Continue good supportive tennis shoes and sneakers and strengthening exercises -Return to clinic PRN  Thresa EMERSON Sar, DPM [facility]  Dr. Thresa EMERSON Sar, DPM    [address] [place], [place] [address]                Office [phone]  Fax [phone]

## 2023-12-10 DIAGNOSIS — E559 Vitamin D deficiency, unspecified: Secondary | ICD-10-CM | POA: Diagnosis not present

## 2023-12-10 DIAGNOSIS — E210 Primary hyperparathyroidism: Secondary | ICD-10-CM | POA: Diagnosis not present

## 2023-12-12 DIAGNOSIS — E210 Primary hyperparathyroidism: Secondary | ICD-10-CM | POA: Diagnosis not present
